# Patient Record
Sex: Female | Born: 1975 | Race: White | Hispanic: No | Marital: Married | Smoking: Never smoker
Health system: Northeastern US, Academic
[De-identification: ages and names within clinical notes are randomized; demographics above are authoritative.]

## PROBLEM LIST (undated history)

## (undated) ENCOUNTER — Encounter

## (undated) MED FILL — LOMAIRA 8 MG TABLET: 8 8 mg | ORAL | 30 days supply | Qty: 30 | Fill #0 | Status: CN

## (undated) MED FILL — LISDEXAMFETAMINE 30 MG CAPSULE: 30 30 mg | ORAL | 30 days supply | Qty: 30 | Fill #0 | Status: CN

## (undated) MED FILL — CONCERTA 36 MG TABLET,EXTENDED RELEASE: 36 36 mg | ORAL | 30 days supply | Qty: 30 | Fill #0 | Status: CN

## (undated) MED FILL — WEGOVY 0.25 MG/0.5 ML SUBCUTANEOUS PEN INJECTOR: 0.25 0.25 mg/0.5 mL | SUBCUTANEOUS | 28 days supply | Qty: 2 | Fill #0 | Status: CN

## (undated) MED FILL — WEGOVY 0.5 MG/0.5 ML SUBCUTANEOUS PEN INJECTOR: 0.5 0.5 mg/0.5 mL | SUBCUTANEOUS | 28 days supply | Qty: 2 | Fill #0 | Status: CN

## (undated) MED FILL — CELECOXIB 200 MG CAPSULE: 200 200 mg | ORAL | 90 days supply | Qty: 90 | Fill #0 | Status: CN

## (undated) MED FILL — VENLAFAXINE ER 150 MG CAPSULE,EXTENDED RELEASE 24 HR: 150 150 mg | ORAL | 90 days supply | Qty: 90 | Fill #0 | Status: CN

## (undated) MED FILL — METHYLPHENIDATE ER 36 MG TABLET,EXTENDED RELEASE 24 HR: 36 36 mg | ORAL | 60 days supply | Qty: 60 | Fill #0 | Status: CN

## (undated) MED FILL — MELOXICAM 15 MG TABLET: 15 15 mg | ORAL | 30 days supply | Qty: 30 | Fill #0 | Status: CN

## (undated) MED FILL — CYCLOSPORINE 0.05 % EYE DROPS IN A DROPPERETTE: 0.05 0.05 % | OPHTHALMIC | 90 days supply | Qty: 180 | Fill #0 | Status: CN

## (undated) MED FILL — WEGOVY 0.5 MG/0.5 ML SUBCUTANEOUS PEN INJECTOR: 0.5 0.5 mg/0.5 mL | SUBCUTANEOUS | 28 days supply | Qty: 2 | Fill #1 | Status: CN

## (undated) MED FILL — NAPROXEN 500 MG TABLET: 500 500 mg | ORAL | 30 days supply | Qty: 60 | Fill #0 | Status: CN

---

## 2015-10-15 ENCOUNTER — Ambulatory Visit (HOSPITAL_BASED_OUTPATIENT_CLINIC_OR_DEPARTMENT_OTHER)

## 2015-10-15 NOTE — Progress Notes (Signed)
* * *        **  [name]**    ------    22 Y old Female, DOB: 1975-09-01    [address] [place], [place] [address]    Home: [phone]    Provider: [name]        * * *    Telephone Encounter    ---    Answered by    Burnadette Pop    Date: 10/15/2015        Time: 06:03 PM    Caller    pt    ------            Reason    refill            Message                      please mail to her and let her know  last cpe 08/14/15 last refill 08/14/15 per [facility] last filled 08/14/15 only prescribed by you                      Action Taken    Huntleigh Doolen B 10/15/2015 6:39:54 PM > TF, please go ahead  and prepare. Then send to [facility] Xxx to approve. [facility] 10/16/2015 8:43:26 AM  > Rx printed, forwarded to Dr Lenore Manner 10/16/2015 9:04:20 AM >  noted PH Eleny Cortez B 10/16/2015 1:59:11 PM > OK.            Refills    Refill Vyvanse capsule, 70 mg, orally, 30, 1 cap(s), once a day  (in the morning), 30 days, Refills=0    ------          * * *                ---          * * *          Patient: [name] DOB: 06/28/1976 Provider: [name] 10/15/2015    ---    Note generated by eClinicalWorks EMR/PM Software ([email or web address])

## 2015-10-25 ENCOUNTER — Ambulatory Visit

## 2015-11-02 ENCOUNTER — Ambulatory Visit

## 2015-11-20 ENCOUNTER — Ambulatory Visit (HOSPITAL_BASED_OUTPATIENT_CLINIC_OR_DEPARTMENT_OTHER)

## 2015-11-20 NOTE — Progress Notes (Signed)
* * *        [name]**    ------    40 Y old Female, DOB: [birth year]    Account Number: [id number]    [address], [place], [place]    Home: [phone]    Guarantor: [name] Insurance: [employer]: HMO BLUE [place] - OPTIONS    PCP: Karlyne Greenspan, JR    Appointment Facility: [facility]        * * *    11/20/2015    Progress Notes: [name], NP    ------    ---        **Reason for Appointment**    ---      1\. Follow up on medication, only takes when she feels she needs to    ---      **History of Present Illness**    ---    _Follow-up_ :    Here for ADD f/u. Started on [place], which her son takes. Uses 4x a week.  Not Friday or weekends. Takes 1/2 of 70 mg. Is in meetings for hours and this  allows her to focus and pay attention. Has noticed a difference. Not  distracted. No side effect, no palpitations. One day took Sudafed and HR went  to 115. So she stopped it. No CP, irreg heartbeat.    Has depression and anxiety. Also started to work out. Good right now. Nice  weather helps. She works with Daron Offer and Jonnie Finner, psych NP. Just  works with them professionally, but they give her advice.    Had injection into back by Dr. Nona Dell. Helped 75%. Has f/u in June. The numbing  med worked great for 2-3 days, then came back slowly. He thinks it's a  fasciitis.    Has lost 15 lb by doing [employer] 4 days a week in the mornings.      **Current Medications**    ---    Taking        * Vyvanse 70 mg capsule 1 cap(s) orally once a day (in the morning)         ---          * Celexa 20 mg tablet TAKE ONE TABLET BY MOUTH DAILY         ---          * ProAir HFA CFC free 90 mcg/inh aerosol with adapter 2 puff(s) inhaled 20-30 min before exercise         ---          * Wellbutrin XL 150 mg/24 hours 2 tabs orally every 24 hours         ---          * Ativan 0.5 mg tablet 1 tab(s) orally HS, prn, Notes: prn         ---          * Zyrtec 10 mg tablet 1 tab(s) orally once a  day, Notes: prn         ---          * Fioricet 325 mg-50 mg-40 mg tablet 2 tab(s) orally Q4H maximum 6 per day prn, Notes: prn         ---          * Cyclobenzaprine Hydrochloride 5 mg tablet 1 tab(s) orally HS, can increase to TID as needed, Notes: prn         ---  Not-Taking      * Astelin 137 mcg/inh spray 2 spray(s) intranasally 2 times a day, Notes: prn         ---          * omeprazole 20 mg delayed release capsule 1 cap(s) orally once a day, Notes: prn         ---          * Medication List reviewed and reconciled with the patient         ---      **Past Medical History**    ---      anxiety - stable w/Celexa    ---    mirena IUD 7/06, 7/11    ---    anemia w/ preg, normal hgb at [facility] visit    ---    GERD - uses OTC Prilosec prn    ---    asthma, mild - occ use of ProAir    ---    migraines - respond to Fioricet, not often    ---    BMD 2009 - normal    ---    PAP SMEAR (ASC-US) with neg HPV 2008, all repeat PAPs WNL    ---      **Family History**    ---      Father: alive 76 yrs, anxiety, basal cell removed, depression,  cardiomyopathy, diagnosed with Heart Disease, Cancer    ---    Mother: alive 70 yrs, hypertension, Diabetes, hypothyroid, renal cell CA,  orthostatic tremor, MI. stents, CHF, diagnosed with Diabetes, Hypertension,  Heart Disease    ---    Sibling 1: alive 61 yrs, biological brother, drug addiction; hda 2 sisters and  4 brothers - one sister HIV who is dying; lost a sister to a brain aneurysm  (half sister), bio brother is an addict, 1/2 brother dying of ETOH and drug  induced liver disease, one 1/2 brothers one w/ulcerative colitis, one 1/2  brother has anxiety but is healthy, diagnosed with Mental Illness    ---    Maternal Grand Mother: deceased 75 yrs, died of an MI    ---    Aunt(s): deceased 46 yrs, cardiogenic shock and respiratory arrest from an MI,  smoker    ---    has 1 bio brother, 1 half sis died, has another sister and 3 half brothers;  cousin deceased from [place] age 82.  No breast, ovarian or colon cancers. Has 1  full brother and 5 half siblings; all healthy.    ---      **Social History**    ---    no Smoking  Status: never smoker  , Patient counseled on the dangers of  tobacco use: 11/20/2015.    Seat belt: yes.    Alcohol: socially.    no Drug use.    Diet/Nutrition: "gained the freshman 40," and more thereafter, daily yogurt,  no supplements, daily fruits and vegetables, whole grains, problem is portion  sizes and snacking/picking.    Exercise: yes, just started Cross[employer], walks a lot.    Marital Status: married, "awesome".    no Domestic violence.    Children: 2, son, [name], 10 yrs and daughter, [name] 13 yrs.    Number of household members: 4, self, spouse, 2 children.    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    no Travel outside [place].    Religion: Catholic.    Caffeine: yes, 2 cups coffee per day.  Home smoke detector use: yes.    Pets: yes, 2 dogs.    Sexual orientation: heterosexual.    Sexually active: yes.      **Gyn History**    ---    Periods : none on Mirena.    Sexual activity currently sexually active, monogamous.    Last pap smear date 08/02/14 Neg, 01/15/2010 Neg, absent TE zone.    Abnormal pap smear none.    Date of Last Period Mirena IUD, no periods.    STD none.    Birth control Mirena IUD.      **OB History**    ---    Total pregnancies 2\.    Total living children 2003 - Julia, 2006 - Joshua.    Pregnancy # 1: NSVD, no complications.    Pregnancy # 2: NSVD, no complications.      **Allergies**    ---      codeine: vomiting    ---      **Vital Signs**    ---    HR 72, BP 116/70, Temp 98.2, Ht 62, Wt 169, Wt Change -11 lb, BMI  30.91  .      **Examination**    ---    _General Examination_ :    General Appearance:  well developed and well-nourished  ,  alert and oriented  ,  speech normal in rate, tone and volume  ,  good eye contact  ,  thought  processes logical  .    HEENT:  clear conjunctiva  , o  ropharynx clear with MMM  .     Neck, Thyroid :  supple, no lymphadenopathy  ,  no thyromegaly  .    Heart:  RRR, normal S1S2  ,  no murmurs  .    Lungs:  clear to auscultation  ,  regular breathing rate and effort  .    Skin:  normal, no rash  .    Neurologic Exam:  non-focal exam  .          **Assessments**    ---    1\. 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 Chronic low back pain - M54.5    ---    3\. Obesity, unspecified - E66.9    ---    4\. 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 Attention deficit disorder of adult**    Stop Vyvanse capsule, 70 mg, 1 cap(s), orally, once a day (in the morning)    Continue Vyvanse capsule, 30 mg, 1 cap(s), orally, once a day (in the morning)    Notes: Doing well. Focus has improved. Taking 1/2 pill so will change RX to 30  mg. Not due for refill yet. No side effects. Advised to return in 6 months for  quick f/u.    ---        **2\. Chronic low back pain**    Notes: Had injection by Dr. Nona Dell, helped a lot initially. Effect now waning  but doing OK.        **3\. Obesity, unspecified**    Notes: Losing weight by exercising through Cross-[employer]. Is careful not to overdo  it. Congratulated on her success.        **4\. Depression with anxiety**    Continue Celexa tablet, 20 mg, TAKE ONE TABLET BY MOUTH DAILY    Continue  Wellbutrin XL, 150 mg/24 hours, 2 tabs, orally, every 24 hours    Continue Ativan tablet, 0.5 mg, 1 tab(s), orally, HS, prn, Notes: prn    Notes: Stable on meds. Doing well.      **Follow Up**    ---    6 months x 20 min (Reason: ADD)    Electronically signed by [name] , RNCFNP on 11/20/2015 at 09:34 AM EDT    Sign off status: Completed        * * [facility]    [address]    [place], [place] [address]    Tel: [phone]    Fax: [phone]              * * *          Patient: [name], [name] DOB: 10-24-1975 Progress Note: [name], NP  11/20/2015    ---    Note generated by eClinicalWorks  EMR/PM Software ([email or web address])

## 2016-01-01 ENCOUNTER — Ambulatory Visit (HOSPITAL_BASED_OUTPATIENT_CLINIC_OR_DEPARTMENT_OTHER)

## 2016-01-01 NOTE — Progress Notes (Signed)
* * *        **  [name]**    ------    89 Y old Female, DOB: 02-Mar-1976    [address] [place], [place] [address]    Home: [phone]    Provider: [name]        * * *    Telephone Encounter    ---    Answered by    Adrienne Mocha    Date: 01/01/2016        Time: 12:15 PM    Caller    pt    ------            Reason    Refills            Message                      pt lvm, would like refill of Fioricet (last rx 08/14/15) and refill of Vyvanse ( last filled 10/30/15), pt is UTD w/CPE 08/14/15. Please send Fioricet to [facility] and mail Vyvanse to pt at home                Action Taken    Fancy Dunkley B 01/01/2016 12:25:13 PM > Vyvanase printed,  Fioricet E-RX'd. [facility] 01/01/2016 2:48:51 PM > mailed Vyvanse rx to  pt            Refills    Refill Fioricet tablet, 325 mg-50 mg-40 mg, orally, 30, 2 tab(s),  Q4H maximum 6 per day prn, 90 days, Refills=0    ------      Refill Vyvanse capsule, 30 mg, orally, 30, 1 cap(s), once a day (in the  morning), Refills=0          * * *                ---          * * *          PatientTALETHA, [name] DOB: March 24, 1976 Provider: [name] 01/01/2016    ---    Note generated by eClinicalWorks EMR/PM Software ([email or web address])

## 2016-01-15 ENCOUNTER — Ambulatory Visit

## 2016-02-06 ENCOUNTER — Ambulatory Visit (HOSPITAL_BASED_OUTPATIENT_CLINIC_OR_DEPARTMENT_OTHER)

## 2016-02-06 NOTE — Progress Notes (Signed)
* * *        **  [name]**    ------    42 Y old Female, DOB: May 19, 1976    [address] [place], [place] [address]    Home: [phone]    Provider: [name]        * * *    Telephone Encounter    ---    Answered by    Adrienne Mocha    Date: 02/06/2016        Time: 05:13 PM    Caller    pt    ------            Reason    Refills            Message                      pt lvm, requests refill Ativan, CPE current, F/U 11/20/15, no pending appts      checked [facility], Ativan not listed                Action Taken    Ervie Mccard B 02/06/2016 5:47:18 PM > RX ready to fax.  [facility] 02/06/2016 5:58:44 PM > rx faxed            Refills    Refill Ativan tablet, 0.5 mg, orally, 30, 1 tab(s), HS, prn, 30  days, Refills=1    ------          * * *                ---          * * *          Patient: [name], [name] DOB: Sep 18, 1975 Provider: [name] 02/06/2016    ---    Note generated by eClinicalWorks EMR/PM Software ([email or web address])

## 2016-04-04 ENCOUNTER — Ambulatory Visit (HOSPITAL_BASED_OUTPATIENT_CLINIC_OR_DEPARTMENT_OTHER): Admitting: Family Medicine

## 2016-04-04 NOTE — Progress Notes (Signed)
* * *        [name]**    ------    66 Y old Female, DOB: 12/30/1975    [address] [place], [place] [address]    Home: [phone]    Provider: Manus Rudd        * * *    Telephone Encounter    ---    Answered by    Adrienne Mocha    Date: 04/04/2016        Time: 11:05 AM    Caller    pt    ------            Reason    Refills            Message                      pt lvm for refill Fioricet, CPE UTD, no pending appts, last Rx 01/01/16                Action Taken    [name],Jonda Alanis Maud Deed 04/04/2016 12:31:48 PM > refilled  [facility] 04/04/2016 12:42:53 PM > rx faxed, pt notified            Refills    Refill Fioricet tablet, 325 mg-50 mg-40 mg, orally, 30, 2 tab(s),  Q4H maximum 6 per day prn, 90 days, Refills=0    ------          * * *              * * *        ---        Reason for Appointment    ---      1\. Refills    ---      Current Medications    ---      None    ---      Past Medical History    ---      anxiety - stable w/Celexa    ---    mirena IUD 7/06, 7/11    ---    anemia w/ preg, normal hgb at [facility] visit    ---    GERD - uses OTC Prilosec prn    ---    asthma, mild - occ use of ProAir    ---    migraines - respond to Fioricet, not often    ---    BMD 2009 - normal    ---    PAP SMEAR (ASC-US) with neg HPV 2008, all repeat PAPs WNL    ---      Gyn History    ---    Periods : none on Mirena.    Sexual activity currently sexually active, monogamous.    Last pap smear date 08/02/14 Neg, 01/15/2010 Neg, absent TE zone.    Abnormal pap smear none.    Date of Last Period Mirena IUD, no periods.    STD none.    Birth control Mirena IUD.      OB History    ---    Total pregnancies 2\.    Total living children 2003 - Julia, 2006 - Joshua.    Pregnancy # 1: NSVD, no complications.    Pregnancy # 2: NSVD, no complications.      Allergies    ---      codeine: vomiting    ---      Treatment    ---      **  1\. Others**    Refill Fioricet tablet, 325 mg-50 mg-40 mg, 2  tab(s), orally, Q4H maximum 6  per day prn, 90 days, 30, Refills 0, Notes: prn    ---          * * *          Patient: [name], [name] DOB: 09/04/1975 Provider: Karlyne Greenspan, JR  04/04/2016    ---    Note generated by eClinicalWorks EMR/PM Software ([email or web address])

## 2016-06-02 ENCOUNTER — Ambulatory Visit (HOSPITAL_BASED_OUTPATIENT_CLINIC_OR_DEPARTMENT_OTHER)

## 2016-06-02 NOTE — Progress Notes (Signed)
* * *        **  [name]**    ------    39 Y old Female, DOB: 1975/12/19    [address], [place], [place] [address]    Home: [phone]    Provider: [name]        * * *    Web Encounter    ---    Answered by    [name]    Date: 06/02/2016        Time: 03:10 PM    Caller    KP    ------            Reason    Biometric Screening Form            Action Taken    Bevan Vu B 06/02/2016 3:10:33 PM > Signed. Pt requested  we send back to her. Sent her msg to see if she wants [place] to send to [facility]. [facility] 06/03/2016 2:08:29 PM > faxed form to pt and to [employer]                * * *        **eMessages**   From:    [name]    ------    Created:    2016-06-02 10:12:11.0    Sent:    2016-06-02 10:13:44.0    Subject:    [id number] Screening Form    Message:    [name],        I signed your biometric screening form. Do you want [place] to fax it to [facility] as well as to you or do you just want [place] to fax it to you?        [name]                    ---          * * *          Patient: [name] DOB: [birth year] Provider: [name] 06/02/2016    ---    Note generated by eClinicalWorks EMR/PM Software ([email or web address])

## 2016-10-09 ENCOUNTER — Ambulatory Visit: Admitting: General Acute Care Hospital

## 2016-10-11 LAB — HX .T-SPOT TB
CASE NUMBER: 2018095000962
HX T-SPOT TB: NEGATIVE
HX TB-PANEL-A: 0
HX TB-PANEL-B: 0

## 2016-12-03 ENCOUNTER — Ambulatory Visit (HOSPITAL_BASED_OUTPATIENT_CLINIC_OR_DEPARTMENT_OTHER)

## 2016-12-03 NOTE — Progress Notes (Signed)
* * *        [name]**    ------    41 Y old Female, DOB: 09-02-1975    Account Number: [id number]    [address], [place], [id number]    Home: [phone]    Guarantor: [name] Insurance: [employer]: HMO BLUE [place] - OPTIONS    PCP: Karlyne Greenspan, JR    Appointment Facility: [facility]        * * *    12/03/2016    Progress Notes: [name], NP    ------    ---        **Reason for Appointment**    ---      1\. lower back pain/sciatica-has a rough weekend, past cortisone with Nona Dell  not much help    ---    2\. needs CPE    ---    3\. low back pain on right side going into hip, needs refill Celexa, TF    ---      **History of Present Illness**    ---    _Sick Visit_ :    On Sat night her back was "spasming," pain going down her leg, really painful,  keeping her up at night. Has been chronic problem but got worse. A year ago  saw Dr. Nona Dell and she had 2 injections into her back @ SI joint. Didn't help.  At Christmas had a massage. Was told her R piraformis was very tight. Has been  opposed to PT but now willing to try it. Just wants ot be pain free.    _Lower back_ :    c/o low back pain. c/o radiation of pain:  to back of R knee, sometimes to the  foot, often has to keep moving if laying in bed  . c/o previous imaging:  MRI  .    Denies : trauma/injury:. tingling/ numbness:. bowel and bladder incontinence.  saddle anaesthesia. previous problems/surgeries:. previous surgery:  2  injections w/Dr. Nona Dell  . previous therapy:. Chronic pain meds:  otc meds,  ibuprofen, bothers her stomach  . osteoporosis. osteopenia.    ROM:  normal  .      **Current Medications**    ---    Taking        * Fioricet 325 mg-50 mg-40 mg tablet 2 tab(s) orally Q4H maximum 6 per day prn, Notes: prn         ---          * Celexa 20 mg tablet TAKE ONE TABLET BY MOUTH DAILY         ---          * ProAir HFA CFC free 90 mcg/inh aerosol with adapter 2 puff(s) inhaled 20-30 min before exercise          ---          * Zyrtec 10 mg tablet 1 tab(s) orally once a day, Notes: prn         ---    Not-Taking      * Vyvanse 30 mg capsule 1 cap(s) orally once a day (in the morning)         ---          * Ativan 0.5 mg tablet 1 tab(s) orally HS, prn, Notes: prn         ---          * Wellbutrin XL 150 mg/24 hours 2 tabs orally every 24 hours         ---          *  Cyclobenzaprine Hydrochloride 5 mg tablet 1 tab(s) orally HS, can increase to TID as needed, Notes: prn         ---          * Astelin 137 mcg/inh spray 2 spray(s) intranasally 2 times a day, Notes: prn         ---          * omeprazole 20 mg delayed release capsule 1 cap(s) orally once a day, Notes: prn         ---          * Medication List reviewed and reconciled with the patient         ---      **Past Medical History**    ---      anxiety - stable w/Celexa    ---    mirena IUD 7/06, 7/11    ---    anemia w/ preg, normal hgb at [facility] visit    ---    GERD - uses OTC Prilosec prn    ---    asthma, mild - occ use of ProAir    ---    migraines - respond to Fioricet, not often    ---    BMD 2009 - normal    ---    PAP SMEAR (ASC-US) with neg HPV 2008, all repeat PAPs WNL    ---      **Family History**    ---      Father: alive 13 yrs, anxiety, basal cell removed, depression,  cardiomyopathy, diagnosed with Heart Disease, Cancer    ---    Mother: alive 78 yrs, hypertension, Diabetes, hypothyroid, renal cell CA,  orthostatic tremor, MI. stents, CHF, diagnosed with Diabetes, Hypertension,  Heart Disease    ---    Sibling 1: alive 45 yrs, biological brother, drug addiction; hda 2 sisters and  4 brothers - one sister HIV who is dying; lost a sister to a brain aneurysm  (half sister), bio brother is an addict, 1/2 brother dying of ETOH and drug  induced liver disease, one 1/2 brothers one w/ulcerative colitis, one 1/2  brother has anxiety but is healthy, diagnosed with Mental Illness    ---    Maternal Grand Mother: deceased 29 yrs, died of an MI    ---    Aunt(s):  deceased 34 yrs, cardiogenic shock and respiratory arrest from an MI,  smoker    ---    has 1 bio brother, 1 half sis died, has another sister and 3 half brothers;  cousin deceased from [place] age 48. No breast, ovarian or colon cancers. Has 1  full brother and 5 half siblings; all healthy    half brother 75 yrs died liver disease.    ---      **Social History**    ---    no Smoking  Status: never smoker  , Patient counseled on the dangers of  tobacco use: 12/03/2016.    Seat belt: yes.    Alcohol: socially.    no Drug use.    Diet/Nutrition: "gained the freshman 40," and more thereafter, daily yogurt,  no supplements, daily fruits and vegetables, whole grains, problem is portion  sizes and snacking/picking.    Exercise: yes, just started Cross[employer], walks a lot.    Marital Status: married, "awesome".    no Domestic violence.    Children: 2, son, [name], 67 yrs and daughter, [name] 15 yrs.    Number of household members: 4, self,  spouse, 2 children.    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    no Travel outside [place].    Religion: Catholic.    Caffeine: yes, 2 cups coffee per day.    Home smoke detector use: yes.    Pets: yes, 2 dogs.    Sexual orientation: heterosexual.    Sexually active: yes.      **Gyn History**    ---    Periods : none on Mirena.    Sexual activity currently sexually active, monogamous.    Last pap smear date 08/02/14 Neg, 01/15/2010 Neg, absent TE zone.    Abnormal pap smear none.    Date of Last Period Mirena IUD, no periods.    STD none.    Birth control Mirena IUD.      **OB History**    ---    Total pregnancies 2\.    Total living children 2003 - Julia, 2006 - Joshua.    Pregnancy # 1: NSVD, no complications.    Pregnancy # 2: NSVD, no complications.      **Allergies**    ---      codeine: vomiting    ---      **Review of Systems**    ---    As per HPI.      **Vital Signs**    ---    HR 72, BP 120/80, Temp 98.6, Ht 62, Wt 169, BMI  30.91  .      **Examination**     ---    _General Examination_ :    General  well-nourished, well-hydrated, appears uncomfortable, frustrated  .    _Lower back_ :    Inspection:  normal curvature of spine  .    Palpation:  no vertebral spine tenderness  ,  no paraspinal spasm  ,  \+  tenderness on R SI joint and the sciatic notch  .    Straight leg raising test:  negative bilaterally, elevated R leg causes  burning sensation in R buttock  .    Motor system:  decreased quadriceps strength on R  ,  decreased hamstring  strength on R  .    Sensory exam:  normal bilateral LE  .    Reflexes:  bilaterally symmetrical  .    Gait:  normal  .    ROM  Full ROM w/increased pain from extension and hyperextension, R lateral  bending and R rotation  .          **Assessments**    ---    1\. Low back pain - M54.5 (Primary)    ---    2\. Other chronic pain - G89.29    ---    3\. 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 Had injections by Dr. Nona Dell  that didn't help. Tried chiro and massage. Never did PT. She did respond to a  steroid in the past. Will RX prednisone taper. Advised to avoid Motrin until  she finishes the steroid. Recommended PT at this point. Dr. Silvana Newness did indicate  that if she didn't responde to SI joint injections, she may benefit from L4-5,  L5-S1 injections. She may want to return to Dr. Nona Dell to discuss this option if  PT is ineffective. Piriformis Syndrome: Care Instructions material  was  printed,Piriformis Syndrome: Exercises material was printed.    ---        **2\. Other chronic pain**    Notes: See above.        **3\. Depression with anxiety**    Refill Celexa tablet, 20 mg, TAKE ONE TABLET BY MOUTH DAILY, 90 days, 90,  Refills 3    Notes: She will schedule her CPE. Needs her Celexa refilled. Reviewed  interactions w/Celexa and Cyclobenzaprine.      **Follow Up**    ---    Follow-up with any worsening or persistent symptoms.    Electronically signed by [name] , RNCFNP on 12/10/2016 at 11:46 AM EDT    Sign off status: Completed        * * [facility]    [address]    [place], [place] [address]    Tel: [phone]    Fax: [phone]              * * *          Patient: [name], [name] DOB: 16-Oct-1975 Progress Note: [name], NP  12/03/2016    ---    Note generated by eClinicalWorks EMR/PM Software ([email or web address])

## 2017-01-02 ENCOUNTER — Ambulatory Visit (HOSPITAL_BASED_OUTPATIENT_CLINIC_OR_DEPARTMENT_OTHER)

## 2017-01-02 NOTE — Progress Notes (Signed)
* * *        **  [name]**    ------    26 Y old Female, DOB: [birth year]    [address] [place], [place] [address]    Home: [phone]    Provider: [name]        * * *    Telephone Encounter    ---    Answered by    Adrienne Mocha    Date: 01/02/2017        Time: 02:30 PM    Caller    pt    ------            Reason    Refills            Message                      pt lvm, requesting refill of Astelin nasal spray, allergies have been really bad.  CPE pending 02/04/17.      [phone]                Action Taken    Teshaun Olarte B 01/02/2017 2:48:15 PM > Sent.            Refills    Refill Astelin spray, 137 mcg/inh, intranasally, 1, 2 spray(s), 2  times a day, 30 days, Refills=2    ------          * * *                ---          * * *          Patient: [name] DOB: 08/25/1975 Provider: [name] 01/02/2017    ---    Note generated by eClinicalWorks EMR/PM Software ([email or web address])

## 2017-02-04 ENCOUNTER — Ambulatory Visit (HOSPITAL_BASED_OUTPATIENT_CLINIC_OR_DEPARTMENT_OTHER)

## 2017-02-04 NOTE — Progress Notes (Signed)
* * *        **  [name]**    ------    85 Y old Female, DOB: 1976/06/16    [address], [place], [place] [address]    Home: [phone]    Provider: [name]        * * *    Telephone Encounter    ---    Answered by    Adrienne Mocha    Date: 02/04/2017        Time: 08:59 AM    Caller    pt    ------            Reason    Message            Message                      pt lvm, sorry she forgot about her appt today, has rescheduled to 10/3 @ 8am.  Do you need to see her before that for anything? She is doing ok, going to PT for back pain.  Requests refill of Motrin 800mg                 Action Taken    Wyat Infinger B 02/04/2017 1:21:27 PM > RX sent. I don't need  to see her before her appt as long as everything is going well.            Refills    Refill ibuprofen tablet, 800 mg, orally, 90, 1 tab(s), 3 times a  day, 30 days    ------          * * *                ---          * * *          Patient: [name] DOB: 1975-07-21 Provider: [name] 02/04/2017    ---    Note generated by eClinicalWorks EMR/PM Software ([email or web address])

## 2017-02-12 ENCOUNTER — Ambulatory Visit

## 2017-04-01 ENCOUNTER — Ambulatory Visit

## 2017-04-08 ENCOUNTER — Ambulatory Visit (HOSPITAL_BASED_OUTPATIENT_CLINIC_OR_DEPARTMENT_OTHER)

## 2017-04-08 ENCOUNTER — Ambulatory Visit

## 2017-04-08 LAB — HX COMPREHENSIVE METABOLIC PANEL
CASE NUMBER: 2018276000947
HX ALBUMIN LVL: 4.2 g/dL — NL (ref 3.2–5.0)
HX ALKALINE PHOSPHATASE: 68 U/L — NL (ref 30.0–117.0)
HX ALT: 32 U/L — NL (ref 6.0–55.0)
HX ANION GAP: 7 — NL (ref 3.0–11.0)
HX AST: 14 U/L — NL (ref 6.0–40.0)
HX BILIRUBIN TOTAL: 0.5 mg/dL — NL (ref 0.2–1.2)
HX BUN: 21 mg/dL — ABNORMAL HIGH (ref 6.0–20.0)
HX CALCIUM LVL: 9.8 mg/dL — NL (ref 8.5–10.5)
HX CHLORIDE: 103 mmol/L — NL (ref 98.0–110.0)
HX CO2: 30 mmol/L — NL (ref 21.0–32.0)
HX CREATININE: 0.873 mg/dL — NL (ref 0.55–1.3)
HX GLUCOSE LVL: 85 mg/dL — NL (ref 70.0–110.0)
HX POTASSIUM LVL: 4.2 mmol/L — NL (ref 3.6–5.2)
HX SODIUM LVL: 140 mmol/L — NL (ref 136.0–146.0)
HX TOTAL PROTEIN: 7.7 g/dL — NL (ref 6.0–8.4)

## 2017-04-08 LAB — HX CBC W/ INDICES
CASE NUMBER: 2018276000947
HX ABSOLUTE NRBC COUNT: 0 10*3/uL
HX HCT: 40.9 % — NL (ref 36.0–47.0)
HX HGB: 14 g/dL — NL (ref 11.8–16.0)
HX MCH: 30.4 pg — NL (ref 26.0–34.0)
HX MCHC: 34.2 g/dL — NL (ref 31.0–37.0)
HX MCV: 88.7 fL — NL (ref 80.0–100.0)
HX MPV: 10.1 fL — NL (ref 9.4–12.4)
HX NRBC PERCENT: 0 % — NL
HX PLATELET: 224 10*3/uL — NL (ref 150.0–400.0)
HX RBC: 4.61 10*6/uL — NL (ref 3.9–5.2)
HX RDW-CV: 12.2 % — NL (ref 11.5–14.5)
HX RDW-SD: 39.4 fL — NL (ref 35.0–51.0)
HX WBC: 7.3 10*3/uL — NL (ref 3.7–11.2)

## 2017-04-08 LAB — HX HEMOGLOBIN A1C
CASE NUMBER: 2018276000948
HX EST AVERAGE GLUCOSE (EAG): 103 mg/dL
HX HBF (INTERNAL): 1.3 % — NL
HX HEMOGLOBIN A1C: 5.2 % — NL
HX LA1C (INTERNAL): 1.9 % — NL
HX P3 PEAK (INTERNAL): 3.5 % — NL
HX P4 PEAK (INTERNAL): 1.1 % — NL
HX TOTAL AREA RANGE (INTERNAL): 2.16 microvolt/sec — NL (ref 1.0–3.5)

## 2017-04-08 LAB — HX LIPID PANEL
CASE NUMBER: 2018276000948
HX CHOL: 225 mg/dL — ABNORMAL HIGH
HX HDL: 54 mg/dL — NL
HX LDL: 140 mg/dL — ABNORMAL HIGH
HX TRIG: 153 mg/dL — ABNORMAL HIGH

## 2017-04-08 LAB — HX MAGNESIUM LEVEL
CASE NUMBER: 2018276000948
HX MAGNESIUM LVL: 2.1 mg/dL (ref 1.8–2.5)

## 2017-04-08 LAB — HX GLOMERULAR FILTRATION RATE (ESTIMATED)
CASE NUMBER: 2018276000947
HX AFN AMER GLOMERULAR FILTRATION RATE: >90
HX NON-AFN AMER GLOMERULAR FILTRATION RATE: 84 mL/min/{1.73_m2}

## 2017-04-08 LAB — HX TSH REFLEX PANEL (RECOMMENDED)
CASE NUMBER: 2018276000948
HX 3RD GEN TSH: 2.82 u[IU]/mL — NL (ref 0.358–3.74)

## 2017-04-08 NOTE — Progress Notes (Signed)
* * *        [name]**    ------    41 Y old Female, DOB: 1976-05-05    Account Number: [id number]    [address], [place], [id number]    Home: [phone]    Guarantor: [name] Insurance: [employer]: HMO BLUE [place] - OPTIONS    PCP: Karlyne Greenspan, JR    Appointment Facility: [facility]        * * *    04/08/2017    Progress Notes: [name], NP    ------    ---        **Reason for Appointment**    ---      1\. ecpe, anxiety, requests rx for Omeprazole    ---    2\. received Flu shot 04/01/17 at work . Female CPE 18-39 w/o PAP    ---      **History of Present Illness**    ---    _Interim History_ :    Here for CPE. Last 2/17 with ADD f/u 5/17 and sick visit for LBP 5/18. Now a  case Production designer, theatre/television/film for [employer].    Anxiety is bad. Taking Celexa, wants to try Wellbutrin as augmentation again.  Ran out of Ativan. Doesn't use it much. Not sleeping well. ADD is OK. Not on  Vyvance. It definitely helps but she just worries about taking it.    _Female CPE_ :    Recent consults -  Dr. Nona Dell R pain SI joint, injection, next appt tomorrow,  has helped 70%, PT didn't help  . PAP/HPV -  Date done - 08/02/14  , Neg,  UTD  ,  Repeat due - 07/2019  ,  No GU s/s, declines pelvic exam  . Mammogram -  Start at age 57, no FH  . Immunizations  Due forTdap (last 2007)  Had flu shot  . Contraception  Mirena IUD, replaced 2016, "fine"  .    _PHQ-9_ :    Little interest/pleasure in doing thing  Not at all - 0  . Feeling down,  depressed, or hopeless  Several days - 1  . Trouble falling/staying asleep, or  sleeping too much  More than half the days - 2  . Feeling tired or having  little energy  Not al all - 0  . Poor appetite or overeating  Several days - 1  . Feeling bad about self, like a failure, let people/self down  Several days -  1  . Trouble concentrating e.g. reading the paper, watching TV  Several days -  1  . Moving or speaking slowly or extremely fidgity  Not at all - 0  .  Thoughts of  being better off dead or hurting self  Not at all - 0  . Ability  to function at home/work or get along w/people  Somewhat difficult  .    score=6.      **Current Medications**    ---    Taking        * Celexa 20 mg tablet TAKE ONE TABLET BY MOUTH DAILY         ---          * Astelin 137 mcg/inh spray 2 spray(s) intranasally 2 times a day, Notes: prn         ---          * ibuprofen 800 mg tablet 1 tab(s) orally 3 times a day         ---          *  Fioricet 325 mg-50 mg-40 mg tablet 2 tab(s) orally Q4H maximum 6 per day prn, Notes: prn         ---          * ProAir HFA CFC free 90 mcg/inh aerosol with adapter 2 puff(s) inhaled 20-30 min before exercise         ---          * Zyrtec 10 mg tablet 1 tab(s) orally once a day, Notes: prn         ---          * Ativan 0.5 mg tablet 1 tab(s) orally HS, prn, Notes: prn         ---    Not-Taking      * cyclobenzaprine 10 mg tablet 1 tab(s) orally HS, can increase up to 3 times a day         ---          * omeprazole 20 mg delayed release capsule 1 cap(s) orally once a day, Notes: prn         ---    Discontinued      * Physical Therapy Please evaluate and treat for R sciatica, piriformis         ---          * PREDNISONE 10 MG TABLET 6 TABS QD X 2 DAYS THEN DECREASE BY 1 TAB EVERY 2 DAYS PO         ---          * Vyvanse 30 mg capsule 1 cap(s) orally once a day (in the morning)         ---          * Wellbutrin XL 150 mg/24 hours 2 tabs orally every 24 hours         ---          * Cyclobenzaprine Hydrochloride 5 mg tablet 1 tab(s) orally HS, can increase to TID as needed, Notes: prn         ---          * Medication List reviewed and reconciled with the patient         ---      **Past Medical History**    ---      anxiety - stable w/Celexa    ---    mirena IUD 7/06, 7/11    ---    anemia w/ preg, normal hgb at [facility] visit    ---    GERD - uses OTC Prilosec prn    ---    asthma, mild - occ use of ProAir    ---    migraines - respond to Fioricet, not often    ---    BMD 2009 -  normal    ---    PAP SMEAR (ASC-US) with neg HPV 2008, all repeat PAPs WNL    ---      **Surgical History**    ---      Tonsillectomy 2002    ---    Lasik 2005    ---      **Family History**    ---      Father: alive 61 yrs, anxiety, basal cell removed, depression,  cardiomyopathy, diagnosed with Heart Disease, Cancer    ---    Mother: alive 76 yrs, hypertension, Diabetes, hypothyroid, renal cell CA,  orthostatic tremor, MI. stents, CHF, diagnosed with Diabetes, Hypertension,  Heart Disease    ---  Sibling 1: alive 2 yrs, biological brother, drug addiction; hda 2 sisters and  4 brothers - one sister HIV who is dying; lost a sister to a brain aneurysm  (half sister), bio brother is an addict, 1/2 brother dying of ETOH and drug  induced liver disease, one 1/2 brothers one w/ulcerative colitis, one 1/2  brother has anxiety but is healthy, diagnosed with Mental Illness    ---    Maternal Grand Mother: deceased 105 yrs, died of an MI    ---    Aunt(s): deceased 44 yrs, cardiogenic shock and respiratory arrest from an MI,  smoker    ---    has 1 bio brother, 1 half sis died, has another sister and 3 half brothers;  cousin deceased from [place] age 41. No breast, ovarian or colon cancers. Has 1  full brother and 5 half siblings; all healthy    half brother 84 yrs died liver disease.    ---      **Social History**    ---    no Smoking  Status: never smoker  , Patient counseled on the dangers of  tobacco use: 04/08/2017.    Seat belt: yes.    Alcohol: socially.    no Drug use.    Diet/Nutrition: "gained the freshman 40," and more thereafter, daily yogurt,  no supplements, daily fruits and vegetables, whole grains, problem is portion  sizes and snacking/picking.    Exercise: yes, walks a lot.    Marital Status: married, "awesome".    no Domestic violence.    Children: 2, son, [name], 42 yrs and daughter, [name] 15 yrs.    Number of household members: 5, self, spouse, 2 children,and a 3yo foster  child.    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    no Travel outside [place].    Religion: Catholic.    Caffeine: yes, 2 cups coffee per day.    Home smoke detector use: yes.    Pets: yes, 1 dog.    Sexual orientation: heterosexual.    Sexually active: yes.      **Gyn History**    ---    Periods : none on Mirena.    Sexual activity currently sexually active, monogamous.    Last pap smear date 08/02/14 Neg, 01/15/2010 Neg, absent TE zone.    Abnormal pap smear none.    Date of Last Period Mirena IUD, no periods.    STD none.    Birth control Mirena IUD.      **OB History**    ---    Total pregnancies 2\.    Total living children 2003 - Julia, 2006 - Joshua.    Pregnancy # 1: NSVD, no complications.    Pregnancy # 2: NSVD, no complications.      **Allergies**    ---      codeine: vomiting    ---      **Hospitalization/Major Diagnostic Procedure**    ---      Denies Past Hospitalization    ---      **Review of Systems**    ---    _CONSTITUTIONAL_ :    Loss of Appetite  none  . Unexplained weight change  none  .    _OPTHALMOLOGY_ :    Change in Vision  none  . Glasses or contacts  none, had lasik surgery  ,  sees eye doctor regularly  .    _ENT_ :    Hoarseness  none  .  Swollen Lymph Nodes  none  . Difficulty swallowing  no  .  Dental care  up-to-date  .    _ALLERGY_ :    Environmental Allergies  lots of them but lately off meds  .    _RESPIRATORY_ :    Shortness of Breath  none  . Persistent Cough  none  .    _CARDIOLOGY_ :    Chest Pain  none  . Palpitations  none  . Leg Edema  none  . Exertional  symptoms  none  .    _GASTROENTEROLOGY_ :    Dysphagia  none  . Abdominal Pain  none  . Constipation  none  . Diarrhea  none  . Blood in Stool  none  . Heartburn  none  .    _FEMALE REPRODUCTIVE_ :    Hot Flashes  none  . Abnormal Vaginal Discharge  none  . Dyspareunia  none  .  Irregular Menses  not getting periods but gets cramps and moodiness, bloating  . Breast lump  none  . Practices SBE  regulary  . Vaginal  dryness  none  .    _UROLOGY_ :    Blood in Urine  none  . Urinary Incontinence  none  . Nocturia  None  .  Dysuria  none  .    _MUSCULOSKELETAL_ :    Joint Swelling  none  . Joint Pain  none  . Joint Stiffness  none  .    _NEUROLOGY_ :    Tingling/Numbness  none  . Dizziness  none  . Weakness  none  . Visual Changes  none  . Headaches  none  .    _PSYCHOLOGY_ :    Depression  as above  . Sleep Disturbances  none  . Anxiety  as above  .    _DERMATOLOGY_ :    New or changing skin lesions  none  .    _HEMATOLOGY/LYMPH_ :    Bleeding tendencies  none  .          **Vital Signs**    ---    HR 72, BP 118/78, Temp 98.4, Ht 62, Wt 177, Wt Change 8 lb, BMI  32.37  .      **Examination**    ---    _General Examination_ :    General  well nourished, well-hydrated female in  NAD, anxious  .    HEENT:  TMs and ear canals clear, conjunctiva clear, nares patent, oropharynx  clear with MMM  .    Neck, Thyroid :  supple  ,  no thyromegaly  ,  no lymphadenopathy  .    Breasts :  no lumps felt on either side  ,  no dimpling  ,  no skin changes  ,  no axillary lymphadenopathy  .    Heart:  RRR  ,  no murmurs  .    Lungs:  clear to auscultation bilaterally  .    Abdomen:  soft, NT/ND, BS present  ,  no hepatosplenomegaly  .    Extremities  normal ROM  ,  no edema  .    Peripheral pulses:  normal (2+) bilaterally  .    Skin:  no rash or suspicious skin lesions  .    Neurologic Exam:  non-focal exam  .          **Assessments**    ---    1\. Routine medical exam - Z00.00 (Primary)    ---  2\. Depression with anxiety - F41.8    ---    3\. 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 Acne - L70.9    ---    5\. Migraine headache - G43.909    ---    6\. 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 Allergic rhinitis - J30.9    ---    8\. GERD (gastroesophageal reflux disease) - K21.9    ---    9\. Chronic low back pain - M54.5    ---    10\. Encounter for administration of vaccine - Z23    ---    11\. Immunization due - Z23     ---      **Treatment**    ---      **1\. Routine medical exam**    _LAB: Comprehensive Metabolic Panel - [facility]_ Normal    Albumin Lvl    4.2      3.2-5.0 - Gm/dL    ------------    Alk Phos    68      30-117 - Units/L    ------------    ALT    32      6-55 - Units/L    ------------    AST    14      6-40 - Units/L    ------------    Bili Total    0.5      0.2-1.2 - mg/dL    ------------    BUN    21    H    6-20 - mg/dL    ------------    Calcium Lvl    9.8      8.5-10.5 - mg/dL    ------------    Chloride    103      98-110 - mmol/L    ------------    CO2    30      21-32 - mmol/L    ------------    Creatinine    0.873      0.550-1.300 - mg/dL    ------------    Glucose Lvl    85      70-110 - mg/dL    ------------    Potassium Lvl    4.2      3.6-5.2 - mmol/L    ------------    Sodium Lvl    140      136-146 - mmol/L    ------------    Total Protein    7.7      6.0-8.4 - Gm/dL    ------------    Anion Gap    7      3-11 -    ------------    _LAB: Glycated Hemoglobin [facility]_ Normal  Glycated Hgb    5.2      <=5.6 - %    ------------    Mean Bld Glucos    103      \- mg/dL    ------------    _LAB: Thyroid Screen (TSH, reflex to FT4, T4 or T3)_ Normal  3rd Gen TSH    2.820      0.358-3.740 - mclU/ml    ------------    _LAB: CBC w/ Indices - [facility]_ Normal  Hct    40.9      36.0-47.0 - %    ------------    Hgb    14.0      11.8-16.0 - Gm/dL    ------------  MCH    30.4      26.0-34.0 - pGm    ------------    MCHC    34.2      31.0-37.0 - Gm/dL    ------------    MCV    88.7      80.0-100.0 - fL    ------------    MPV    10.1      9.4-12.4 - fL    ------------    Platelet    224      150-400 - thous/mm3    ------------    RBC    4.61      3.90-5.20 - Mil/mm3    ------------    WBC    7.3      3.7-11.2 - thous/mm3    ------------    RDW-SD    39.4       35.0-51.0 - fL    ------------    _LAB: Lipid Panel_ chol 225, LDL 140, trig 153  Chol    225    H    <=200 -  mg/dL    ------------    HDL    54      >=40 - mg/dL    ------------    LDL    140    H    <=130 - mg/dL    ------------    Status    Fasting      \-    ------------    Trig    153    H    <=150 - mg/dL    ------------      Notes :Prugh,Melanie 04/09/2017 3:11:14 PM > Tevin Shillingford B 04/09/2017  6:57:04 PM > , 10 year ASCVD risk = 0.6 % - low risk, no indication for  medication, continue healthy diet and regular exercise. Lifetime risk 39%    ------    _LAB: Magnesium Level_ Normal  Magnesium Lvl    2.1      1.8-2.5 - mg/dL    ------------        Notes: Immunizations up-to-date. Tdap today. She had her flu shot. Patient  Educated with: TDAP - ADACEL.pdf (TDAP - ADACEL.pdf). PAP is UTD. Will start  mammograms at age 66.        **2\. Depression with anxiety**    Continue Celexa tablet, 20 mg, TAKE ONE TABLET BY MOUTH DAILY, 90 days, 90,  Refills 3    Refill Wellbutrin XL, 150 mg/24 hours, 2 tabs, orally, every 24 hours, 90  days, 180, Refills 0    Refill Ativan tablet, 0.5 mg, 1 tab(s), orally, HS, prn, 30 days, 30    Notes: She still struggles with her anxiety. Working with psych NP and LISCWs  who give her feedback and support. She was on Wellbutrin in the past but wants  to give it another try for augmentation. Will start w/1 tab and increase to 2.  She'll let me know through the portal how she's doing when she needs refills.        **3\. Exercise-induced asthma**    Continue ProAir HFA aerosol with adapter, CFC free 90 mcg/inh, 2 puff(s),  inhaled, 20-30 min before exercise    Notes: Stable.        **4\. Acne**    Notes: No issues currently.        **5\. Migraine headache**    Continue Fioricet tablet, 325 mg-50 mg-40 mg, 2 tab(s), orally, Q4H maximum 6  per day prn    Notes:  Francene Boyers has really helped. Requests refill of Fioricet so she has it prn.        **6\.  Attention deficit disorder of adult**    Stop Vyvanse capsule, 70 mg, 1 cap(s), orally, once a day (in the morning)    Notes: Not taking Vyvanse. Uses it when she's in school or studying. Otherwise  using behavioral methods.        **7\. Allergic rhinitis**    Continue Zyrtec tablet, 10 mg, 1 tab(s), orally, once a day    Refill Astelin spray, 137 mcg/inh, 2 spray(s), intranasally, 2 times a day, 90  days, 3, Refills 3    Notes: Allergies controlled. Astelin also helps her migraines.        **8\. GERD (gastroesophageal reflux disease)**    Refill omeprazole delayed release capsule, 20 mg, 1 cap(s), orally, once a  day, 90 days, 90, Refills 3    Notes: GERD aggravated by weight. Requests refill omeprazole. She tried Zantac  but not as effective. Had an EGD in the past. Discussed cost/benefit of PPIs.        **9\. Chronic low back pain**    Continue Cyclobenzaprine Hydrochloride tablet, 5 mg, 1 tab(s), orally, HS, can  increase to TID as needed    Start ibuprofen tablet, 800 mg, 1 tab(s), orally, 3 times a day, 90 days, 270,  Refills 3    Notes: Continues to see Dr. Nona Dell. Injections have been effective. Has SI,  sciatica and piroformis issues.        **10\. Others**    Notes: Patient Educated with: TD.pdf (TD.pdf).      **Immunization**    ---      Boostrix-Tdap : 0.5 mL (Dose No:1) given by Adrienne Mocha on Left Deltoid    ---      **Preventive Medicine**    ---      Counseling: Diet . Exercise . Sunscreen . Breast self exam .    ---    Screening / Special Tests: Pap Smear Up to Date. Routine eye exam/screening Up  to Date. Routine dental exams Up to date.    ---      **Procedure Codes**    ---      [phone] Boostrix-Tdap    ---    [phone] IMMUNIZATION ADMIN    ---      **Follow Up**    ---    Follow-up for annual visits or prn    Electronically signed by [name] , RNCFNP on 04/09/2017 at 06:58 PM EDT    Sign off status: Completed        * * [facility]    [address]     [place], [place] [address]    Tel: [phone]    Fax: [phone]              * * *          Patient: AERABELLA, GALASSO DOB: [birth year] Progress Note: [name], NP  04/08/2017    ---    Note generated by eClinicalWorks EMR/PM Software ([email or web address])

## 2017-04-09 ENCOUNTER — Ambulatory Visit (HOSPITAL_BASED_OUTPATIENT_CLINIC_OR_DEPARTMENT_OTHER)

## 2017-04-09 NOTE — Progress Notes (Signed)
* * *        **  [name]**    ------    82 Y old Female, DOB: 1975/11/22    [address] [place], [place] [address]    Home: [phone]    Provider: [name]        * * *    Web Encounter    ---    Answered by    [name]    Date: 04/09/2017        Time: 06:59 PM    Caller    KP    ------            Reason    Lab results                * * *        **eMessages**   From:    [name]    ------    Created:    2017-04-09 15:00:02.0    Sent:      Subject:    RE:Lab results    Message:    [name],        All your labs were fine. Take care and let me know if you have any questions.  I'll be on vacation through 10/23 so please reach out to your PCP if you have  any problems in the interim.        [name]                    ---          * * *          Patient: [name] DOB: 11/07/1975 Provider: [name] 04/09/2017    ---    Note generated by eClinicalWorks EMR/PM Software ([email or web address])

## 2017-04-23 ENCOUNTER — Ambulatory Visit

## 2017-05-19 ENCOUNTER — Ambulatory Visit (HOSPITAL_BASED_OUTPATIENT_CLINIC_OR_DEPARTMENT_OTHER)

## 2017-05-19 NOTE — Progress Notes (Signed)
* * *        [name]**    ------    41 Y old Female, DOB: 27-Dec-1975    Account Number: [id number]    [address] [place], [id number]    Home: [phone]    Guarantor: [name] Insurance: BCBS-: HMO BLUE NEW [place] - OPTIONS    PCP: Karlyne Greenspan, JR    Appointment Facility: [facility]        * * *    05/19/2017    Progress Notes: [name], NP    ------    ---        **Reason for Appointment**    ---      1\. Followup on on dentist appt - pt has infection, not sure what to do    ---    2\. L side sinus pain    ---    3\. Headaches    ---    4\. Flu vaccine administered at [place]ite    ---      **History of Present Illness**    ---    _Sick Visit_ :    Has had a lot of facial pain and swelling on the L side. Thought it was her  tooth who sent her to an oral surgeon. Xrays looked like inflammation on floor  of her L maxillary sinus. Gets swelling. Has had to use fioricet more often  the last few weeks. She saw an ED PA who started her on abx 2 months ago.  Augmentin x 5 days. Then happened 2 months ago and she just applied ice. The  dentist gave her an RX for Amox but the oral surgeon told her to see ENT. She  didn't start it. She feels miserable, like she has a sinus infection.    _ENT_ :    c/o EAR FULLNESS. c/o FACIAL PAIN.    Denies : EAR PAIN. NASAL CONGESTION  some pressure  . RHINITIS. POSTNASAL  DRIP. SORE THROAT. COUGH.    _Constitutional_ :    c/o FEVER  subjective  . c/o CHILLS. c/o HEADACHE  worse  .    _Pulmonology_ :    Denies : SHORTNESS OF BREATH. WHEEZING. TOBACCO USE.    _Gastroenterology_ :    Denies : ABDOMINAL PAIN. NAUSEA. VOMITING. DIARRHEA.      **Current Medications**    ---    Taking        * ibuprofen 800 mg tablet 1 tab(s) orally 3 times a day         ---          * Fioricet 325 mg-50 mg-40 mg tablet 2 tab(s) orally Q4H maximum 6 per day prn         ---          * Zyrtec 10 mg tablet 1 tab(s) orally once a day         ---          *  Astelin 137 mcg/inh spray 2 spray(s) intranasally 2 times a day         ---          * omeprazole 20 mg delayed release capsule 1 cap(s) orally once a day         ---          * Cyclobenzaprine Hydrochloride 5 mg tablet 1 tab(s) orally HS, can increase to TID as needed         ---          *  Celexa 20 mg tablet TAKE ONE TABLET BY MOUTH DAILY         ---          * Wellbutrin XL 150 mg/24 hours 2 tabs orally every 24 hours         ---          * Ativan 0.5 mg tablet 1 tab(s) orally HS, prn         ---    Not-Taking      * ProAir HFA CFC free 90 mcg/inh aerosol with adapter 2 puff(s) inhaled 20-30 min before exercise         ---          * ibuprofen 800 mg tablet 1 tab(s) orally 3 times a day         ---          * cyclobenzaprine 10 mg tablet 1 tab(s) orally HS, can increase up to 3 times a day         ---          * Medication List reviewed and reconciled with the patient         ---      **Past Medical History**    ---      anxiety - stable w/Celexa    ---    mirena IUD 7/06, 7/11    ---    anemia w/ preg, normal hgb at [facility] visit    ---    GERD - uses OTC Prilosec prn    ---    asthma, mild - occ use of ProAir    ---    migraines - respond to Fioricet, not often    ---    BMD 2009 - normal    ---    PAP SMEAR (ASC-US) with neg HPV 2008, all repeat PAPs WNL    ---      **Family History**    ---      Father: alive 92 yrs, anxiety, basal cell removed, depression,  cardiomyopathy, diagnosed with Heart Disease, Cancer    ---    Mother: alive 64 yrs, hypertension, Diabetes, hypothyroid, renal cell CA,  orthostatic tremor, MI. stents, CHF, diagnosed with Diabetes, Hypertension,  Heart Disease    ---    Sibling 1: alive 45 yrs, biological brother, drug addiction; hda 2 sisters and  4 brothers - one sister HIV who is dying; lost a sister to a brain aneurysm  (half sister), bio brother is an addict, 1/2 brother dying of ETOH and drug  induced liver disease, one 1/2 brothers one w/ulcerative colitis, one 1/2  brother has  anxiety but is healthy, diagnosed with Mental Illness    ---    Maternal Grand Mother: deceased 43 yrs, died of an MI    ---    Aunt(s): deceased 20 yrs, cardiogenic shock and respiratory arrest from an MI,  smoker    ---    has 1 bio brother, 1 half sis died, has another sister and 3 half brothers;  cousin deceased from [place] age 20. No breast, ovarian or colon cancers. Has 1  full brother and 5 half siblings; all healthy    half brother 34 yrs died liver disease.    ---      **Social History**    ---    no Tobacco Use  Status: never smoker  , Patient counseled on the dangers of  tobacco use: 04/08/2017.    Seat belt: yes.  Alcohol: socially.    no Drug use.    Diet/Nutrition: "gained the freshman 40," and more thereafter, daily yogurt,  no supplements, daily fruits and vegetables, whole grains, problem is portion  sizes and snacking/picking.    Exercise: yes, walks a lot.    Marital Status: married, "awesome".    no Domestic violence.    Children: 2, son, [name], 66 yrs and daughter, [name] 15 yrs.    Number of household members: 5, self, spouse, 2 children,and a 3yo foster  child.    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    no Travel outside [place].    Religion: Catholic.    Caffeine: yes, 2 cups coffee per day.    Home smoke detector use: yes.    Pets: yes, 1 dog.    Sexual orientation: heterosexual.    Sexually active: yes.      **Gyn History**    ---    Periods : none on Mirena.    Sexual activity currently sexually active, monogamous.    Last pap smear date 08/02/14 Neg, 01/15/2010 Neg, absent TE zone.    Abnormal pap smear none.    Date of Last Period Mirena IUD, no periods.    STD none.    Birth control Mirena IUD.      **OB History**    ---    Total pregnancies 2\.    Total living children 2003 - Julia, 2006 - Joshua.    Pregnancy # 1: NSVD, no complications.    Pregnancy # 2: NSVD, no complications.      **Allergies**    ---      codeine: vomiting    ---      **Review of  Systems**    ---    As per HPI.      **Vital Signs**    ---    HR 81, BP 120/80, Temp 98.3, O2 Sat. 96%, Ht 62, Wt 180, Wt Change 3 lb, BMI  32.92  .      **Physical Examination**    ---    _GENERAL_ :    General Appearence:  well nourished  ,  well-developed  ,  alert and oriented  ,  no acute distress  .    _HEENT_ :    Eyes:  conjunctiva clear  . Ears:  ear canals unremarkable  ,  TM's normal  bilaterally  . Nose:  turbinates very inflamed, swollen and tender  ,  congested  . Sinuses:  very tender R maxillary sinus, swollen  . Mouth:  moist  mucus membranes  ,  no lesions  . Throat:  no erythema or exudate  ,  no  tonsillar enlargement  .    _NECK_ :    General:  supple  . Cervical lymph nodes:  no lymphadenopathy  . Thyroid:  no  thyromegaly  ,  no nodules palpated  .    _HEART_ :    Rate:  regular  . Rhythm:  regular  . Heart sounds:  normal S1S2  . Murmurs:  none  .    _LUNGS_ :    Effort:  no respiratory distress  . Rate:  regular  . Auscultation:  CTA  bilaterally  ,  no wheezing/rhonchi/rales  . Airflow:  normal air movement  .          **Assessments**    ---    1\. Acute non-recurrent maxillary sinusitis - J01.00 (Primary)    ---  2\. Migraine headache - G43.909    ---      **Treatment**    ---      **1\. Acute non-recurrent maxillary sinusitis**    Start Augmentin tablet, 875 mg-125 mg, 1 tab(s), orally, every 12 hours, 14  day(s), 28    Notes: She has acute L maxillary sinusitis. Had full w/u by dentist and oral  surgeon who determined it wasn't her teeth. Will start her on Augmentin x  10-14 days. Her migraines have been triggered so will refill her Fioricet. She  doesn't take a lot of meds.    ---        **2\. Migraine headache**    Refill Fioricet tablet, 325 mg-50 mg-40 mg, 2 tab(s), orally, Q4H maximum 6  per day prn, 30    Notes: Discussed rebound. She doesn't take it a lot, but using more than usual  for herself.      **Follow Up**    ---    F/U any worsening or persistent symptoms.     Electronically signed by [name] , RNCFNP on 05/19/2017 at 10:21 AM EST    Sign off status: Completed        * * [facility]    [address]    [place], [place] [address]    Tel: [phone]    Fax: [phone]              * * *          Patient: CATE, ORAVEC DOB: May 03, 1976 Progress Note: [name], NP  05/19/2017    ---    Note generated by eClinicalWorks EMR/PM Software ([email or web address])

## 2017-06-03 ENCOUNTER — Ambulatory Visit (HOSPITAL_BASED_OUTPATIENT_CLINIC_OR_DEPARTMENT_OTHER): Admitting: Family Medicine

## 2017-06-03 NOTE — Progress Notes (Signed)
* * *        **  [name]**    ------    63 Y old Female, DOB: 17-Apr-1976    [address], [place], [place] [address]    Home: [phone]    Provider: Manus Rudd        * * *    Telephone Encounter    ---    Answered by    Ulis Rias    Date: 06/03/2017        Time: 02:32 PM    Caller    pt    ------            Reason    referral            Message                      please pt called requesting a referral for [facility]             Appt: 06/19/2017 @ 10:15am      dx: sinus pressure and pain      visits: 6      doctor: Barron Schmid NPI: pt did not know       phone: [phone]      fax:                 Action Taken    [facility] 06/03/2017 6:24:09 PM > will process                * * *                ---          * * *          Patient: [name], PURINGTON DOB: 02-06-1976 Provider: Karlyne Greenspan, JR  06/03/2017    ---    Note generated by eClinicalWorks EMR/PM Software ([email or web address])

## 2017-06-09 ENCOUNTER — Ambulatory Visit

## 2017-06-18 ENCOUNTER — Ambulatory Visit (HOSPITAL_BASED_OUTPATIENT_CLINIC_OR_DEPARTMENT_OTHER)

## 2017-06-18 NOTE — Progress Notes (Signed)
* * *        [name]**    ------    36 Y old Female, DOB: 06-01-1976    [address] [place], [place] [address]    Home: [phone]    Provider: [name]        * * *    Telephone Encounter    ---    Answered by    Ivy Lynn    Date: 06/18/2017        Time: 04:39 PM    Caller    pt left VM    ------            Reason    refill Ativan            Message                      requesting refill of Ativan 0.5mg , to [facility].  Checked [facility], last script written 04/08/17, filled same date.  CPE current, last FU appt 05/19/17, no pending appts.                Action Taken    [facility] 06/19/2017 10:59:05 AM > RX printed  Kleigh Hoelzer B 06/19/2017 12:38:09 PM > signed. [facility] 06/19/2017  12:42:23 PM > rx faxed            Refills    Continue Ativan tablet, 0.5 mg, orally, 30, 1 tab(s), HS, prn, 30  days, Refills=0    ------          * * *              * * *        ---        Reason for Appointment    ---      1\. refill Ativan    ---      Current Medications    ---    Taking      * ibuprofen 800 mg tablet 1 tab(s) orally 3 times a day     ---    * Zyrtec 10 mg tablet 1 tab(s) orally once a day     ---    * Astelin 137 mcg/inh spray 2 spray(s) intranasally 2 times a day     ---    * omeprazole 20 mg delayed release capsule 1 cap(s) orally once a day     ---    * Cyclobenzaprine Hydrochloride 5 mg tablet 1 tab(s) orally HS, can increase to TID as needed     ---    * Celexa 20 mg tablet TAKE ONE TABLET BY MOUTH DAILY     ---    * Wellbutrin XL 150 mg/24 hours 2 tabs orally every 24 hours     ---    * Ativan 0.5 mg tablet 1 tab(s) orally HS, prn     ---    * Augmentin 875 mg-125 mg tablet 1 tab(s) orally every 12 hours     ---    * Fioricet 325 mg-50 mg-40 mg tablet 2 tab(s) orally Q4H maximum 6 per day prn     ---    Not-Taking    * ProAir HFA CFC free 90 mcg/inh aerosol with adapter 2 puff(s) inhaled 20-30 min before exercise     ---    * ibuprofen 800  mg tablet 1 tab(s) orally 3 times a day     ---    * cyclobenzaprine 10  mg tablet 1 tab(s) orally HS, can increase up to 3 times a day     ---      Past Medical History    ---      anxiety - stable w/Celexa    ---    mirena IUD 7/06, 7/11    ---    anemia w/ preg, normal hgb at [facility] visit    ---    GERD - uses OTC Prilosec prn    ---    asthma, mild - occ use of ProAir    ---    migraines - respond to Fioricet, not often    ---    BMD 2009 - normal    ---    PAP SMEAR (ASC-US) with neg HPV 2008, all repeat PAPs WNL    ---      Gyn History    ---    Periods : none on Mirena.    Sexual activity currently sexually active, monogamous.    Last pap smear date 08/02/14 Neg, 01/15/2010 Neg, absent TE zone.    Abnormal pap smear none.    Date of Last Period Mirena IUD, no periods.    STD none.    Birth control Mirena IUD.      OB History    ---    Total pregnancies 2\.    Total living children 2003 - Julia, 2006 - Joshua.    Pregnancy # 1: NSVD, no complications.    Pregnancy # 2: NSVD, no complications.      Allergies    ---      codeine: vomiting    ---      Assessments    ---    1\. 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 Depression with anxiety**    Continue Ativan tablet, 0.5 mg, 1 tab(s), orally, HS, prn, 30 days, 30,  Refills 0    ---          * * *          PatientZULEICA, [name] DOB: [birth year] Provider: [name] 06/18/2017    ---    Note generated by eClinicalWorks EMR/PM Software ([email or web address])

## 2017-06-19 ENCOUNTER — Ambulatory Visit

## 2017-06-19 NOTE — Progress Notes (Signed)
* * *        **  [name]**    ------    12 Y old Female, DOB: Nov 10, 1975    [address], [place], [place], [place] [address]    Home: [phone]    Provider: America Brown        * * *    Telephone Encounter    ---    Answered by    Carlton Adam    Date: 06/19/2017        Time: 10:39 AM    Reason    rx clarifaction    ------            Message                      Imelda from [facility] pharm is calling to clarify if it is a dose pack Medrol 4 mg is a dose pack?                 Action Taken                      Siris Hoos B 06/19/2017 11:03:14 AM > yes      CLEGG,ASHLEY  06/19/2017 11:24:51 AM > lvm for pharmacy, ty                     * * *                ---          * * *          Patient: LASHONDRA, VAQUERANO DOB: 1975-11-17 Provider: Arsenio Loader B  06/19/2017    ---    Note generated by eClinicalWorks EMR/PM Software ([email or web address])

## 2017-06-19 NOTE — Progress Notes (Signed)
* * *        [name]**    ------    28 Y old Female, DOB: 17-Jan-1976    Account Number: [id number]    [address], [place], [id number]    Home: [phone]    Guarantor: [name] Insurance: [facility] Payer ID: [id number]    PCP: Annamary Rummage Referring: Annamary Rummage    Appointment Facility: [facility]        * * *    06/19/2017    Arsenio Loader, MD    ------    ---        **Current Medications**    ---    Taking      * Celexa     ---    * Flonase     ---    * omeprazole     ---    Not-Taking    * Fioricet 325 mg-50 mg-40 mg tablet 2 tab(s) orally every 6-8 hrs ,prn pain     ---    * Zyrtec     ---    * albuterol     ---    * Astelin 137 mcg/inh spray 2 spray(s) intranasally 2 times a day     ---    * Prednisone (high dose taper) 10 mg tab 6 tabs once a day x 7 days, then decrease by one tab per day until all tabs are gone orally     ---    * Medication List reviewed and reconciled with the patient     ---      Past Medical History    ---      Esophageal reflux.        ---    Depression.        ---      **Surgical History**    ---      tonsillectomy    ---      **Family History**    ---      Father: unknown, diagnosed with Unk Fam    ---    HIGH BLOOD PRESSURE, EMPHYSEMA\/COPD, THYROID DISEASE, CANCER, HEART DISEASE,  GERD, DIABETES, HIGH CHOLESTEROL, ASTHMA.    ---      **Social History**    ---    Smoking: yes  Are you a: former smoker  , How long has it been since you last  smoked? > 10 years.    no Recreational drug use.    Alcohol: yes, occasional.    SHE IS A NURSE.    ---      **Allergies**    ---      codeine    ---      **Hospitalization/Major Diagnostic Procedure**    ---      No Hospitalization History.    ---      **Review of Systems**    ---    _A detailed ROS was performed and is negative except for positives noted  below: (ROS sheet in office chart)._ :    Dizziness/vertigo yes. Sense of ear blockage yes. Ear pain yes. Sinus  problems  yes. Facial pain yes. Recurrent sore throats yes. Headaches yes.            **Reason for Appointment**    ---      1\. sinus pain    ---      **History of Present Illness**    ---    __ :    41 year old  female with a history of migraine headaches previously seen by  both Dr. Trinna Balloon and Dr. Sylvan Cheese for sinusitis here today complaining of facial pain  and pressure. Patient reports that since July she has had recurrent facial  pain/headaches involving both her cheeks and over her left eye and forehead.  Pain also radiates to her left ear. +photophobia. She also feels like the left  side of her face is swollen. Patient also recently noted some gum pain which  he thought was from flossing too hard. She was seen by her dentist told her  that her teeth looked fine but imaging at the dentist revealed "inflammation  in her sinuses". Since her last visit in this office she was seen by an  allergist. She underwent allergy testing which revealed "allergies to  everything", including cats, dogs, pollen, trees, dust. She considered  immunotherapy but did not have the time. She is currently using Flonase daily  and taking Zyrtec. She previously was also on Astelin but stopped this as she  did not feel like she noted any improvement with this medication. She reports  postnasal drip, but denies nasal congestion, rhinorrhea, mucopurulence. No  recent fevers. No recent imaging of her sinuses. She does have a history of  migraine headaches and was seen by a neurologist in the past. Patient reports  neurologist told her that the headaches were related to her allergies and her  sinuses. She is previously sleep taken Fioricet in the past for the headaches  but has not taken this recently. Was recently treated with a two-week course  of Augmentin. Did not help her symptoms.      **Examination**    ---    _Constitutional_ :    General Appearance:  awake, alert, no apparent distress  .    _Orbits_ :    EOM's  intact,  no  spontaneous nystagmus  .    _Ear_ :    Right:  Pinna without lesions. External auditory canal is clear without edema  or erythema. The tympanic membrane is intact without evidence of middle ear  effusion  . Left:  Pinna without lesions. External auditory canal is clear  without edema or erythema. The tympanic membrane is intact without evidence of  middle ear effusion  .    _Nose_ :    Speculum exam:  Anterior nasal cavity is clear bilaterally without crusting,  mucopurulence or epistaxis.Moderately edematpus nasal mucosa. Septum is  midline. Bilateral inferior turbinates normal appearing. No obvious masses or  polyps.  Osvaldo Angst cavity/Oropharynx_ :    Lips:  no lesions  . Teeth:  adaquate dentition  . FOM:  no masses or lesions  . Tongue:  symmetric movement, no masses or lesions  . Palate:  no masses or  lesions. Symmetric soft palate rise  . Oropharynx:  no tonsil mass or lesion,  posterior oropharyngeal wall mucosa without lesions  . Oral vestibule:  no  lesions  .    _Larynx/Hypopharynx_ :    voice  No hoarseness  .    _Neck_ :    Thyroid:  No palpable nodules  . Parotid  normal, no palpable masses  bilaterally  . Palpation:  No masses or lymphadenopathy  . Submandibular  gland:  normal, no masses  .    _Skin of Head/Neck_ :    :  No obvious lesions  .    _Cranial Nerve assessment_ :    Nerves 3 - 12:  symmetric bilaterally  .  _Head/Face_ :    Normocephalic/atraumtic. Face symmetric, House Brackmann grade I/VI  bilaterally.    _Respiratory_ :    non-labored on room air, no stridor/stertor. No increased work of breathing.          **Assessments**    ---    1\. Facial pain - R51 (Primary)    ---    2\. Non-seasonal allergic rhinitis due to other allergic trigger - J30.89    ---    3\. Otalgia of left ear - H92.02    ---      Nasal endoscopy showing mucosal edema however no evidence of active  sinusitis, no purulence or nasal polyps. The endoscopy was unremarkable. Given  the timing, location, and character  of his symptoms, the possibility that the  patient s headaches may not be solely of a sinonasal etiology was briefly  reviewed. The patient is asked to consider other contributors to headaches. We  discussed the various etiologies of facial pain, namely that, 1) sinonasal  problems can be the source, 2) sinonasal problems can be a trigger of facial  pain, or 3) facial pain is a neurologic problem by itself that can result in  sinonasal symptoms since the same nerves innervate both the face and sinonasal  areas. I will check a CT sinus to rule out occult sinus disease. If CT is  negative for sinus disease will consider possibility of visual migraines or  neurologist as a source of her pain. Discussed with patient that her left  otalgia is likely related to eustachian tube from her allergies. May also be  neurogenic in origin. Ear it self is normal appearing. Will treat with course  of steroids due to the persistent edema in her nasal cavity despite topical  steroids and PO antihistamine. Recommend use of nasal irrigations twice daily.  Continue with Flonase and p.o. antihistamine. She will follow-up after CT.    ---      **Treatment**    ---      **1\. Facial pain**    Start Medrol tablet, 4 mg, as directed, orally, as instructed, 6 days, 1,  Refills 0    _IMAGING: CT scan: Sinus (thin section for use preop with [facility])_    ---      **Procedures**    ---    _Fiberoptic endoscopy_ :    Technique  Flexible fiberoptic laryngoscopy performed under topical anesthesia  with neosynephrine/ lidocaine  . Indications  otalgia  . Findings    Scope passed through the RIGHT nasal cavity to the glottis and then withdrawn  without issue. Patient tolerated the procedure well.No mass or lesion in the  nasopharynx, oropharynx, supraglottis, hypopharynx, or glottis. The eustachian  tube orifices were patent bilaterally. The true vocal folds were mobile and  symmetric bilaterally. Airway widely patent.  . Consent  The consent form  was  reviewed with the patient and  signed  .    _SINONASAL endoscopy:_ :    Indication:  Evaluate the nasal cavity and nasopharynx  . Details of procedure  Bilateral flexible nasal endoscopy performed  . Findings  Nasal cavity: Nasal  mucosa decongested well but still somewhat boggy. Nasal cavities clear without  masses or lesions. Bilateral inferior turbinates and middle turbinates were  unremarkable without polyps or masses. The inferior and middle meati were  clear without obvious purulence or polyps. The sphenoethmoidal recess was  clear bilaterally. Septum midline. +Significant pain just with movement of  scope within the nasal cavities b/lNasopharynx: clear  without mass or lesion.  The eustachian tube orifices were clear bilaterally  .              **Procedure Codes**    ---      [id number] Sinus Endoscopy    ---      **Follow Up**    ---    Patient feels comfortable with the plan as outlined above, and was encouraged  to call or return earlier than planned with any questions, concerns, or  changes. Worrisome symptoms to look out for were reviewed. It was a pleasure  meeting with this patient today.    Electronically signed by Arsenio Loader , MD on 06/19/2017 at 08:44 AM EST    Sign off status: Completed        * * [facility]    [address]    [address]    [place], [place] [address]    Tel: [phone]    Fax: [phone]              * * *          Patient: [name], [name] DOB: 14-Nov-1975 Progress Note: Arsenio Loader, MD  06/19/2017    ---    Note generated by eClinicalWorks EMR/PM Software ([email or web address])

## 2017-06-22 ENCOUNTER — Ambulatory Visit

## 2017-06-24 ENCOUNTER — Ambulatory Visit

## 2017-07-17 ENCOUNTER — Ambulatory Visit

## 2017-07-17 NOTE — Progress Notes (Signed)
* * *        [name]**    ------    53 Y old Female, DOB: 11/27/1975    Account Number: [id number]    [address], [place], [id number]    Home: [phone]    Guarantor: [name] Insurance: [facility] Payer ID: [id number]    PCP: Annamary Rummage Referring: Annamary Rummage    Appointment Facility: [facility]        * * *    07/17/2017    Arsenio Loader, MD    ------    ---        **Current Medications**    ---    Taking      * Celexa     ---    * Flonase     ---    * omeprazole     ---    * Medrol 4 mg tablet as directed orally as instructed     ---    Not-Taking    * Fioricet 325 mg-50 mg-40 mg tablet 2 tab(s) orally every 6-8 hrs ,prn pain     ---    * Zyrtec     ---    * albuterol     ---    * Astelin 137 mcg/inh spray 2 spray(s) intranasally 2 times a day     ---    * Prednisone (high dose taper) 10 mg tab 6 tabs once a day x 7 days, then decrease by one tab per day until all tabs are gone orally     ---    * Medication List reviewed and reconciled with the patient     ---      Past Medical History    ---      Esophageal reflux.        ---    Depression.        ---      **Surgical History**    ---      tonsillectomy    ---      **Family History**    ---      Father: unknown, diagnosed with Unk Fam    ---    HIGH BLOOD PRESSURE, EMPHYSEMA\\\\\/COPD, THYROID DISEASE, CANCER, HEART  DISEASE, GERD, DIABETES, HIGH CHOLESTEROL, ASTHMA.    ---      **Social History**    ---    Smoking: yes  Are you a: former smoker  , How long has it been since you last  smoked? > 10 years.    no Recreational drug use.    Alcohol: yes, occasional.    SHE IS A NURSE.    ---      **Allergies**    ---      codeine    ---      **Hospitalization/Major Diagnostic Procedure**    ---      No Hospitalization History.    ---      **Review of Systems**    ---    _A detailed ROS was performed and is negative except for positives noted  below: (ROS sheet in office chart)._ :    No  positives. Patient indicates all items are negative. ..            **Reason for Appointment**    ---      1\. Sinusitis    ---      **History of Present Illness**    ---    __ :  42 year old female with a history of migraine headaches, facial pain/pressure,  environmental allergies, here for follow up after CT Sinus. Continues to have  facial pain, intermittent swelling of the left side of her face, and left  otalgia. Reports facial pain is worse on the left and feels like a "burning"  pain that extends from her left cheek and radiates up to left ear. Now with  nasal congestion, rhinorrhea but reports she developed a cold last week. No  improvement in her symptoms after the Medrol Dosepak. Reports she was  symptomatic at the time of the CT. Has used Flonase and nasal irrigations as  instructed.    CT Sinus 06/24/17 [facility]: Frontal sinuses absent bilaterally. Bilateral  maxillary, sphenoids, and anterior and posterior ethmoid air cells well  aerated. High riding maxillary tooth roots extending into the maxillary  sinuses bilaterally. Minimal thickening of inferior mucosa and left maxillary  sinus. Left-sided concha bullosa. Bilateral ostiomeatal complexes patent.      **Examination**    ---    _Constitutional_ :    General Appearance:  awake, alert, no apparent distress  .    _Orbits_ :    EOM's  intact,  no spontaneous nystagmus  .    _Ear_ :    Right:  Pinna without lesions. External auditory canal is clear without edema  or erythema. The tympanic membrane is intact without evidence of middle ear  effusion  . Left:  Pinna without lesions. External auditory canal is clear  without edema or erythema. The tympanic membrane is intact without evidence of  middle ear effusion  .    _Nose_ :    Speculum exam:  Anterior nasal cavity is clear bilaterally without crusting,  mucopurulence or epistaxis. Mildly edematous nasal mucosa. Septum is midline.  Bilateral inferior turbinates normal appearing. No obvious masses or  polyps.  Osvaldo Angst cavity/Oropharynx_ :    Lips:  no lesions  . Teeth:  adaquate dentition  . FOM:  no masses or lesions  . Tongue:  symmetric movement, no masses or lesions  . Palate:  no masses or  lesions. Symmetric soft palate rise  . Oropharynx:  no tonsil mass or lesion,  posterior oropharyngeal wall mucosa without lesions  . Oral vestibule:  no  lesions  .    _Larynx/Hypopharynx_ :    voice  No hoarseness  .    _Neck_ :    Thyroid:  No palpable nodules  . Parotid  normal, no palpable masses  bilaterally  . Palpation:  No masses or lymphadenopathy  . Submandibular  gland:  normal, no masses  .    _Skin of Head/Neck_ :    :  No obvious lesions  .    _Respiratory_ :    non-labored on room air, no stridor/stertor. No increased work of breathing.          **Assessments**    ---    1\. Facial pain - R51 (Primary)    ---    2\. Non-seasonal allergic rhinitis due to other allergic trigger - J30.89    ---    3\. Otalgia of left ear - H92.02    ---      CT sinus unremarkable. Given the timing, location, and character of his  symptoms, the possibility that the patient s headaches may not be solely of a  sinonasal etiology was reviewed. The patient is asked to consider other  contributors to headaches. We discussed the various etiologies of facial  pain,  namely that, 1) sinonasal problems can be the source, 2) sinonasal problems  can be a trigger of facial pain, or 3) facial pain is a neurologic problem by  itself that can result in sinonasal symptoms since the same nerves innervate  both the face and sinonasal areas. In light of a negative CT sinus and given  her histpry of migraines, suspect pain is neurogenic in origin. Additionally  the description of burning pain is suggestive of possible trigeminal  neuralgia. Suspect that otalgia is related to this likely neurogenic pain.  Endoscopy was unremarkable last visit and ear continues to be normal in  appearance. Recommend neurology evaluation. Patient will call [facility] for appt and obtain referral from PCP. Discussed that given her history  of allergies there may also be an allergic component to this. She last saw her  allergist about a year ago. Discussed that if negative neuro workup with then  recommend returning to the allergist for further evaluation and management.  She should continue Flonase, nasal irrigations, and p.o. antihistamine for  now.    ---      **Follow Up**    ---    Patient feels comfortable with the plan as outlined above, and was encouraged  to call or return earlier than planned with any questions, concerns, or  changes. Worrisome symptoms to look out for were reviewed. It was a pleasure  meeting with this patient today.    Electronically signed by Arsenio Loader , MD on 07/17/2017 at 08:55 AM EST    Sign off status: Completed        * * [facility]    [address]    [place], [place] [address]    Tel: [phone]    Fax: [phone]              * * *          Patient: [name], [name] DOB: [birth year] Progress Note: Arsenio Loader, MD  07/17/2017    ---    Note generated by eClinicalWorks EMR/PM Software ([email or web address])

## 2017-07-28 ENCOUNTER — Ambulatory Visit

## 2017-07-31 ENCOUNTER — Ambulatory Visit

## 2017-08-20 ENCOUNTER — Ambulatory Visit: Admitting: General Acute Care Hospital

## 2017-08-22 LAB — HX .T-SPOT TB
CASE NUMBER: 2019045001058
HX T-SPOT TB: NEGATIVE
HX TB-PANEL-A: 0
HX TB-PANEL-B: 0

## 2017-09-01 ENCOUNTER — Ambulatory Visit

## 2017-09-08 ENCOUNTER — Ambulatory Visit

## 2017-10-01 ENCOUNTER — Ambulatory Visit

## 2017-12-18 ENCOUNTER — Ambulatory Visit (HOSPITAL_BASED_OUTPATIENT_CLINIC_OR_DEPARTMENT_OTHER)

## 2017-12-18 NOTE — Progress Notes (Signed)
* * *        **  [name]**    ------    17 Y old Female, DOB: Oct 20, 1975    [address], [place], [place] [address]    Home: [phone]    Provider: [name]        * * *    Telephone Encounter    ---    Answered by    Doug Sou, LPN, Terri    Date: 12/18/2017        Time: 10:43 AM    Caller    pt    ------            Reason    Refills            Message                      pt lvm, requests refill flexeril, CPE 04/2017, had F/U 11/18, no pending appts                Action Taken    Zayvian Mcmurtry B 12/18/2017 3:31:14 PM > Sent. Fortunato, LPN  ,Terri 03/04/5620 4:21:54 PM > pt notified rx sent            Refills    Refill cyclobenzaprine tablet, 10 mg, orally, 30, 1 tab(s), HS,  can increase up to 3 times a day, 30 days, Refills=0    ------          * * *                ---          * * *          Patient: [name] DOB: Apr 10, 1976 Provider: [name] 12/18/2017    ---    Note generated by eClinicalWorks EMR/PM Software ([email or web address])

## 2017-12-22 ENCOUNTER — Ambulatory Visit

## 2018-02-01 ENCOUNTER — Ambulatory Visit (HOSPITAL_BASED_OUTPATIENT_CLINIC_OR_DEPARTMENT_OTHER)

## 2018-02-01 NOTE — Progress Notes (Signed)
* * *        **  [name]**    ------    89 Y old Female, DOB: 1976/01/26    [address] [place], [place] [address]    Home: [phone]    Provider: [name]        * * *    Telephone Encounter    ---    Answered by    [name]    Date: 02/01/2018        Time: 08:48 AM    Caller    pt left VM    ------            Reason    refill Fioricet            Message                      pt requesting refill of Fioricet, to [facility].      callback # [phone]                            Action Taken                      [name]  02/01/2018 9:33:39 AM > called pt, she is out of the medication, but she is okay to wait until tomorrow for refill.  Per chart, last refilled 05/19/17 at FU appt.  CPE current, last FU appt 05/19/17, no pending appts.        Calum Cormier B 02/01/2018 1:52:29 PM > Sent.       [name]  02/01/2018 1:58:24 PM > called pt and advised her of same.                Refills    Refill Fioricet tablet, 325 mg-50 mg-40 mg, orally, 30, 2 tab(s),  Q4H maximum 6 per day prn    ------          * * *                ---          * * *          Patient: [name] DOB: Oct 23, 1975 Provider: [name] 02/01/2018    ---    Note generated by eClinicalWorks EMR/PM Software ([email or web address])

## 2018-02-18 ENCOUNTER — Ambulatory Visit (HOSPITAL_BASED_OUTPATIENT_CLINIC_OR_DEPARTMENT_OTHER)

## 2018-02-18 NOTE — Progress Notes (Signed)
* * *        [name]**    ------    27 Y old Female, DOB: 1975/08/20    [address], [place], [place] [address]    Home: [phone]    Provider: [name]        * * *    Telephone Encounter    ---    Answered by    Doyce Loose    Date: 02/18/2018        Time: 06:18 PM    Caller    pt    ------            Reason    mri            Message                      would like to discuss getting an MRI on her back has been getting injections from [facility] and he referred her to  [facility] in [place] and they want MRI first and [facility] says theres no more care he can give her at this time its really confusing  please call [phone] or office [phone]                Action Taken                      Judene Logue B 02/19/2018 5:03:43 PM > Spoke to pt. She has failed PT, nerve blocks and steroid injections. Is better for a short time and sxs recur. I will order a repeat MRI as last one 2016. She is arranging to see a Equities trader and will get back to [place] w/appt date and details.        Golden,Rachel  02/22/2018 5:12:14 PM > faxed referral requset from [facility] Dr. Farris Has NPI [id number] DOS 03/18/18 dx: M54.31 NEW ORTHO appt. Looks like pt said she was going to get an appt at [facility] but this request is from [facility]. Will need a PHO. Spoke to RL, he said redirect back to [facility]. Called and spoke to pt, she will cx [facility] appt and schedule with  once MRI is scheduled. 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       Brandonn Capelli B 02/22/2018 5:26:27 PM > OK.                 Refills    Refill cyclobenzaprine tablet, 10 mg, orally, 30, 1 tab(s), HS,  can increase up to 3 times a day, 30 days, Refills=0    ------          * * *              * * *        ---        Reason for Appointment    ---      1\. Mri    ---      Assessments    ---    1\. Lumbago with sciatica, right side - M54.41 (Primary)    ---      Treatment    ---      **1\. Lumbago with sciatica, right side**    _IMAGING: MRI Spine  Lumbosacral C-_     Londan Coplen B 02/19/2018 5:10:21  PM : pt has failed PT, numerous steroid injections and nerve blocks    ------        **2\. Others**    Refill  cyclobenzaprine tablet, 10 mg, 1 tab(s), orally, HS, can increase up to  3 times a day, 30 days, 30, Refills 0          * * *          Patient: [name], [name] DOB: [birth year] Provider: [name] 02/18/2018    ---    Note generated by eClinicalWorks EMR/PM Software ([email or web address])

## 2018-02-24 ENCOUNTER — Ambulatory Visit (HOSPITAL_BASED_OUTPATIENT_CLINIC_OR_DEPARTMENT_OTHER)

## 2018-02-24 NOTE — Progress Notes (Signed)
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 You should be able  to read the report. The radiologist also noticed hypointense appearance of the  cervix. I don't know what that means, but let's get another PAP test as was  recommended in the report. You can call to schedule. I'm on vacation from 8/28  to 9/17 so OK if you want to schedule with another provider.        [name]                    ---          * * *          Patient: [name] DOB: 1976-01-04 Provider: [name] 02/24/2018    ---    Note generated by eClinicalWorks EMR/PM Software ([email or web address])

## 2018-02-24 NOTE — Progress Notes (Signed)
* * *        **  [name]**    ------    89 Y old Female, DOB: 03/22/1976    [address] [place], [place] [address]    Home: [phone]    Provider: [name]        * * *    Telephone Encounter    ---    Answered by    Cordie Grice    Date: 02/24/2018        Time: 11:40 AM    Caller    patient    ------            Reason    appointment            Message                      patient called, schedule ecpe for 04/21/18 @ 12pm - she stated she received a msg re: needing pap done . she wanted me to ask if its ok for her to wait until then and have it all done at her ecpe appointment .                 Action Taken                      Shanterica Biehler B 02/24/2018 11:55:20 AM > It was an incidental finding on her MRI and she had a PAP 3.5 years ago, so should be OK to wait until October, but can never say for sure.       Craig,Joanna  02/24/2018 2:26:22 PM > spoke with patient - she is aware                     * * *                ---          * * *          Patient: [name], [name] DOB: [birth year] Provider: [name] 02/24/2018    ---    Note generated by eClinicalWorks EMR/PM Software ([email or web address])

## 2018-02-25 ENCOUNTER — Ambulatory Visit

## 2018-03-01 ENCOUNTER — Ambulatory Visit (HOSPITAL_BASED_OUTPATIENT_CLINIC_OR_DEPARTMENT_OTHER): Admitting: Family Medicine

## 2018-03-01 NOTE — Progress Notes (Signed)
* * *        **  [name]**    ------    50 Y old Female, DOB: December 16, 1975    [address] [place], [place] [address]    Home: [phone]    Provider: Manus Rudd        * * *    Telephone Encounter    ---    Answered by    Jeralene Huff    Date: 03/01/2018        Time: 12:02 PM    Reason    referral request    ------            Message                      [facility] Dr. Glendell Docker NPI [id number] f: [phone] dx: M54.2 requesting 6 visits DOS 03/04/18      Ok to process referral?                Action Taken                      Thelma Barge E 03/01/2018 1:34:48 PM > ok      Golden,Rachel  03/01/2018 4:08:15 PM > will process referral                    * * *                ---          * * *          Patient: [name] DOB: January 22, 1976 Provider: Karlyne Greenspan, JR  03/01/2018    ---    Note generated by eClinicalWorks EMR/PM Software ([email or web address])

## 2018-03-04 ENCOUNTER — Ambulatory Visit: Admitting: Neurological Surgery

## 2018-03-04 ENCOUNTER — Ambulatory Visit: Admit: 2018-03-04 | Payer: HMO

## 2018-03-04 NOTE — Progress Notes (Signed)
.  Progress Notes  .  Patient: [name], [name]  Provider: Glendell Docker    .  DOB: 1976/07/07 Age: 42 Y Sex: Female  .  PCP: Katheran Awe MD  Date: 03/04/2018  .  --------------------------------------------------------------------------------  .  REASON FOR APPOINTMENT  .  1. Low back pain and right leg pain  .  HISTORY OF PRESENT ILLNESS  .  NEUROSURGERY:  42 year old female presents in  initial neurosurgical consultation with low back pain and right  leg pain. She has worked as a Engineer, civil (consulting) for many years. She had PT in  2015 without relief. She had MRI in 2016. She saw Dr. Silvana Newness and  Dr. Nona Dell for injections times six without much relief. The pain  radiates from the right buttock to the posterior thigh to the  posterior calf. She has tried PT recently from 05/2017 to 09/2017  without relief.  .  Associated Providers:  Primary Care Provider  Katheran Awe, MD .  .  CURRENT MEDICATIONS  .  Taking Allegra  Taking Celexa  Taking Motrin  Taking Wellbutrin  Medication List reviewed and reconciled with the patient  .  PAST MEDICAL HISTORY  .  Atypical facial nerve pain  .  ALLERGIES  .  Codeine Sulfate  .  SURGICAL HISTORY  .  Tonsillectomy  .  SOCIAL HISTORY  .  .  Tobaccohistory:Never smoked.  Marland Kitchen  HOSPITALIZATION/MAJOR DIAGNOSTIC PROCEDURE  .  Denies Past Hospitalization  .  REVIEW OF SYSTEMS  .  GENERAL:  .  Negative for:    weight loss within the past year,, weight gain  within the past year,, chest pain,, irregular heart beats,, heart  murmurs,, nausea / vomiting,, easy bruising,, fever / chills,,  frequent nose bleeds,, hoarse voice,, shortness of breath,,  breast discharge,, kidney disease,, liver disease,, previous  anesthesiea problems,, excess bleeding during or after prior  procedure, . Positive for:    frequent headache, .  .  NEUROLOGY:  .  Positive for:    back pain, weakness . Negative for:    ringing  in the ears,, trouble with balance and coordination,, hearing  loss,, vision loss in one eye or  the other,, difficulty  swallowing,, double vision,, loss of smell, .  .  VITAL SIGNS  .  Pain scale 8, Ht-in 62, Wt-lbs 180, BMI 32.92, BP 141/84, RR 16,  Pulse sitting 84.  .  PHYSICAL EXAMINATION  .  NEUROSURGERY:  MOTOR Lower Extremities  : 5/5 strength in the bilateral lower  extremities. :5/5 strength in the bilateral lower extremities.  SENSATION  : Intact to light touch in the bilateral lower  extremities. :Intact to light touch in the bilateral lower  extremities. Reflexes  Left Knee Jerk 2+, Right Knee Jerk 2+,  Left Ankle Jerk 2+, Right Ankle Jerk 2+. Left Knee Jerk2+ , Right  Knee Jerk2+ , Left Ankle Jerk2+ , Right Ankle Jerk2+.  DIAGNOSTIC STUDIES:  MRI  Uploaded to PACS. I reviewed MRI lumbar spine performed on  02/23/18 at [facility]) which is essentially a normal  study without any significant pathology or right sided nerve  compression.  .  ASSESSMENTS  .  Sciatica of right side - M54.31 (Primary)  .  Imp: Right sided sciatica with an umremarkable lumbar MRI and  normal neurologic exam. I cannot demonstrate a spinal origin for  this pain and therefore I'm going to refer her to physiatry to  evaluate for non-lumbar causes of sciatica  such as piriformis  syndrome, hamstring injury, etc...  .  FOLLOW UP  .  prn  .  Electronically signed by Glendell Docker , MD on  03/04/2018 at 05:00 PM EDT  .  Document electronically signed by Glendell Docker    .

## 2018-03-04 NOTE — Progress Notes (Signed)
* * *        [name]**    --- ---    42 Y old Female, DOB: [birth year], External MRN: [id number]    Account Number: [id number]    [address] [place], [place]    Home: [phone]    Insurance: [employer]    PCP: Dr. Katheran Awe, MD Referring: Enis Slipper    Appointment Facility: Neurosurgery        * * *    03/04/2018  Progress Notes: Glendell Docker, MD **CHN#:** [id number]    --- ---    ---         **Reason for Appointment**    ---       1\. Low back pain and right leg pain    ---       **History of Present Illness**    ---     _NEUROSURGERY_ :    42 year old female presents in initial neurosurgical consultation with low  back pain and right leg pain. She has worked as a Engineer, civil (consulting) for many years. She  had PT in 2015 without relief. She had MRI in 2016. She saw Dr. Silvana Newness and Dr.  Nona Dell for injections times six without much relief. The pain radiates from the  right buttock to the posterior thigh to the posterior calf. She has tried PT  recently from 05/2017 to 09/2017 without relief.     _Associated Providers_ :    Primary Care Provider Katheran Awe, MD .      **Current Medications**    ---    Taking     * Allegra     ---    * Celexa     ---    * Motrin     ---    * Wellbutrin     ---    * Medication List reviewed and reconciled with the patient    ---       **Past Medical History**    ---       Atypical facial nerve pain.        ---       **Surgical History**    ---       Tonsillectomy    ---      **Social History**    ---    Tobacco  history: Never smoked.      **Allergies**    ---       Codeine Sulfate    ---       **Hospitalization/Major Diagnostic Procedure**    ---       Denies Past Hospitalization    ---       **Review of Systems**    ---     _GENERAL_ :    Negative for: weight loss within the past year,, weight gain within the past  year,, chest pain,, irregular heart beats,, heart murmurs,, nausea /  vomiting,, easy bruising,, fever / chills,, frequent nose bleeds,,  hoarse  voice,, shortness of breath,, breast discharge,, kidney disease,, liver  disease,, previous anesthesiea problems,, excess bleeding during or after  prior procedure,. Positive for: frequent headache,.    _NEUROLOGY_ :    Positive for: back pain, weakness. Negative for: ringing in the ears,, trouble  with balance and coordination,, hearing loss,, vision loss in one eye or the  other,, difficulty swallowing,, double vision,, loss of smell,.          **Vital Signs**    ---  Pain scale 8, Ht-in 62, Wt-lbs 180, BMI 32.92, BP 141/84, RR 16, Pulse sitting  84.       **Physical Examination**    ---     _NEUROSURGERY_ :    MOTOR Lower Extremities : 5/5 strength in the bilateral lower extremities.  SENSATION : Intact to light touch in the bilateral lower extremities. Reflexes  Left Knee Jerk 2+, Right Knee Jerk 2+, Left Ankle Jerk 2+, Right Ankle Jerk  2+.    _DIAGNOSTIC STUDIES_ :    MRI Uploaded to PACS. I reviewed MRI lumbar spine performed on 02/23/18 at  [facility]) which is essentially a normal study without any  significant pathology or right sided nerve compression.          **Assessments**    ---    1\. Sciatica of right side - M54.31 (Primary)    ---      Imp: Right sided sciatica with an umremarkable lumbar MRI and normal  neurologic exam. I cannot demonstrate a spinal origin for this pain and  therefore I'm going to refer her to physiatry to evaluate for non-lumbar  causes of sciatica such as piriformis syndrome, hamstring injury, etc...    ---       **Follow Up**    ---    prn    Electronically signed by Glendell Docker , MD on 03/04/2018 at 05:00 PM EDT    Sign off status: Completed        * * *        Neurosurgery    [address] [place], [address]    [place], [place] [address]    Tel: [phone]    Fax: [phone]              * * *          Patient: [name], [name] DOB: September 12, 1975 Progress Note: Glendell Docker, MD  03/04/2018    ---    Note generated by eClinicalWorks EMR/PM Software  ([email or web address])

## 2018-03-25 ENCOUNTER — Ambulatory Visit (HOSPITAL_BASED_OUTPATIENT_CLINIC_OR_DEPARTMENT_OTHER)

## 2018-03-25 NOTE — Progress Notes (Signed)
* * *        [name]**    ------    55 Y old Female, DOB: 06-Aug-1975    [address] [place], [place] [address]    Home: [phone]    Provider: [name]        * * *    Web Encounter    ---    Answered by    Staff, Clinical    Date: 03/25/2018        Time: 08:56 PM    Caller    [name]    ------            Reason    Sciatica            Message                      Addressed To Jean Rosenthal to see a specialist at [facility], told me that my pain is not my spine. He believes it is all caused by my sciatic nerve. In the past you've given me Gabapentin but I didn't really take it as prescribed because as you know Meds give me anxiety. Would you be willing to allow me to try a low dose gabapentin again? I do have an upcoming appt with you on October. It's ok if you wanna like like me to wait until then.  Just figured I ask.       Thank you             [facility]                Action Taken                      Kysa Calais B 03/26/2018 8:31:05 AM > TF please check [place] PAT before I send in RX.       Fortunato, LPN ,Terri  6/38/7564 9:07:46 AM > per [place] PAT, no gabapentin listed, only  Lorazepam last filled 06/19/17 #30.       Shahrzad Koble B 03/26/2018 9:38:51 AM > Sent.                Refills    Start gabapentin capsule, 300 mg, orally, 90, 1 cap(s), 3 times a  day, 30 day(s)    ------          * * *        **eMessages**   From:    [name]    ------    Created:    2018-03-26 08:31:03.0    Sent:    2018-03-26 08:31:09.0    Subject:    PP:IRJJOACZ    Message:    [name],        Let's give it a try. Gabapentin dosage is 300 mg TID. I recommend you start  taking it at bedtime. Then see if you can or need to increase to BID then TID.  It can make you tired so it takes some getting used to. I just sent in a month  but it should last you longer unless you always take it TID. Let me know how  it goes.        [name]                    ---          * * *  Patient: [name], [name] DOB: [birth year] Provider: [name] 03/25/2018    ---    Note generated by eClinicalWorks EMR/PM Software ([email or web address])

## 2018-04-21 ENCOUNTER — Ambulatory Visit

## 2018-04-21 ENCOUNTER — Ambulatory Visit (HOSPITAL_BASED_OUTPATIENT_CLINIC_OR_DEPARTMENT_OTHER)

## 2018-04-21 NOTE — Progress Notes (Signed)
* * *        [name]**    ------    42 Y old Female, DOB: 06-15-1976    Account Number: [id number]    [address], [place], [place]    Home: [phone]    Guarantor: [name] Insurance: BCBS-: HMO BLUE [place] - OPTIONS    PCP: Karlyne Greenspan, JR    Appointment Facility: [facility]        * * *    04/21/2018    Progress Notes: [name], NP    ------    ---        **Reason for Appointment**    ---      1\. CPE, seeing podiatry tomorrow    ---    2\. 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 Last 10/18. Now a case Production designer, theatre/television/film for [employer].    Seeing podiatry tomorrow. Thinks she has plantar fasciitis. Back is still  awful. Has seen Dr. Silvana Newness. When she called for f/u because she can't do  anything the nurse called to say there is nothing more to do. He recommended  she see a spine specialist. Has tried injections from Dr. Nona Dell. She also saw a  neurosurgeon at [facility] who told her it's most likely her sciatic nerve. She'll  be seeing a rehab med doctor at [facility]. Can't go to the gym. Exhausted. Has  been to chiro, PT. She just started the neurontin recently, only HS at this  point, and feels it might be helping. She had the RX in the past but never  took it.    Anxiety is OK.    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 PAP/HPV -  Date done - 08/02/14  , Neg,  UTD  ,  Repeat due  - 07/2019  , but MRI indicated hypodense cervix and recommended PAP  .  Mammogram -  Start at age 77, no FH, will put in order  . Immunizations  Tdap  10/18, flu shot today  . Contraception  Mirena IUD, replaced 2016, "fine"  .    _PHQ-9_ :    Little interest/pleasure in doing thing  Not at all - 0  . Feeling down,  depressed, or hopeless  Several days - 1  .  Trouble falling/staying asleep, or  sleeping too much  Several days - 1  . Feeling tired or having little energy  Several days - 1  . Poor appetite or overeating  Several days - 1  . Feeling  bad about self, like a failure, let people/self down  Not at all - 0  .  Trouble concentrating e.g. reading the paper, watching TV  Not at all - 0  .  Moving or speaking slowly or extremely fidgity  Not at all - 0  . Thoughts of  being better off dead or hurting self  Not at all - 0  . Ability to function  at home/work or get along w/people  Not difficult at all  .    score=4.      **Current Medications**    ---    Taking      *  ProAir HFA CFC free 90 mcg/inh aerosol with adapter 2 puff(s) inhaled 20-30 min before exercise     ---    * ibuprofen 800 mg tablet 1 tab(s) orally 3 times a day     ---    * Zyrtec 10 mg tablet 1 tab(s) orally once a day     ---    * Astelin 137 mcg/inh spray 2 spray(s) intranasally 2 times a day     ---    * omeprazole 20 mg delayed release capsule 1 cap(s) orally once a day     ---    * Celexa 20 mg tablet TAKE ONE TABLET BY MOUTH DAILY     ---    * Ativan 0.5 mg tablet 1 tab(s) orally HS, prn     ---    * Fioricet 325 mg-50 mg-40 mg tablet 2 tab(s) orally Q4H maximum 6 per day prn     ---    * cyclobenzaprine 10 mg tablet 1 tab(s) orally HS, can increase up to 3 times a day     ---    * gabapentin 300 mg capsule 1 cap(s) orally 3 times a day     ---    Discontinued    * ibuprofen 800 mg tablet 1 tab(s) orally 3 times a day     ---    * Cyclobenzaprine Hydrochloride 5 mg tablet 1 tab(s) orally HS, can increase to TID as needed     ---    * Wellbutrin XL 150 mg/24 hours 2 tabs orally every 24 hours     ---    * Augmentin 875 mg-125 mg tablet 1 tab(s) orally every 12 hours     ---    * Wellbutrin XL 150 mg/24 hours tablet, extended release TAKE 2 TABLETS BY MOUTH EVERY 24 HOURS     ---    * Medication List reviewed and reconciled with the patient     ---      **Past Medical History**    ---       anxiety - stable w/Celexa.        ---    mirena IUD 7/06, 7/11.        ---    anemia w/ preg, normal hgb at [facility] visit.        ---    GERD - uses OTC Prilosec prn.        ---    asthma, mild - occ use of ProAir.        ---    migraines - respond to Fioricet, not often.        ---    BMD 2009 - normal.        ---    PAP SMEAR (ASC-US) with neg HPV 2008, all repeat PAPs WNL.        ---      **Surgical History**    ---      Tonsillectomy 2002    ---    Lasik 2005    ---      **Family History**    ---      Father: alive 39 yrs, anxiety, basal cell removed, depression,  cardiomyopathy, diagnosed with Heart Disease, Cancer    ---    Mother: alive 55 yrs, hypertension, Diabetes, hypothyroid, renal cell CA,  orthostatic tremor, MI. stents, CHF, Hypertension, Heart Disease, Diabetes    ---    Sibling 1: alive 51 yrs, biological brother, drug addiction; hda 2 sisters  and  4 brothers - one sister HIV who is dying; lost a sister to a brain aneurysm  (half sister), bio brother is an addict, 1/2 brother dying of ETOH and drug  induced liver disease, one 1/2 brothers one w/ulcerative colitis, one 1/2  brother has anxiety but is healthy, Mental Illness    ---    Maternal Grand Mother: deceased 7 yrs, died of an MI    ---    Aunt(s): deceased 28 yrs, cardiogenic shock and respiratory arrest from an MI,  smoker    ---    has 1 bio brother, 1 half sis died, has another sister and 3 half brothers;  cousin deceased from [place] age 34. No breast, ovarian or colon cancers. Has 1  full brother and 5 half siblings; all healthy    half brother 42 yrs died liver disease.    ---      **Social History**    ---    no Tobacco Use  Status: never smoker  , Patient counseled on the dangers of  tobacco use: 04/21/2018.    Alcohol: socially.    Marital Status: married, "awesome".    Children: 2, son, [name], 23 yrs and daughter, [name] 15 yrs.    Sexually active: yes.    Religion: Catholic.    Home smoke detector use: yes.    Pets: yes, 1 dog.     no Drug use, CBD oil on her back, helps.    Exercise: yes, walks a lot, difficult w/back pain.    Caffeine: yes, 2 cups coffee per day.    no Domestic violence.    Diet/Nutrition: "gained the freshman 40," and more thereafter, daily yogurt,  no supplements, daily fruits and vegetables, whole grains, problem is portion  sizes and snacking/picking.    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    Number of household members: 5, self, spouse, 2 children,and a 4yo foster  child.    Seat belt: yes.    no Travel outside [place].    Sexual orientation: heterosexual.      **Gyn History**    ---    Periods : none on Mirena.    Sexual activity currently sexually active, monogamous.    Last pap smear date 08/02/14 Neg, 01/15/2010 Neg, absent TE zone.    Abnormal pap smear none.    Date of Last Period Mirena IUD, no periods.    STD none.    Birth control Mirena IUD.      **OB History**    ---    Total pregnancies 2\.    Total living children 2003 - Julia, 2006 - Joshua.    Pregnancy # 1: NSVD, no complications.    Pregnancy # 2: NSVD, no complications.      **Allergies**    ---      codeine: vomiting    ---      **Hospitalization/Major Diagnostic Procedure**    ---      Denies Past Hospitalization    ---      **Review of Systems**    ---    _CONSTITUTIONAL_ :    Loss of Appetite  none  . Unexplained weight change  none  .    _OPTHALMOLOGY_ :    Change in Vision  none  . Glasses or contacts  none, had lasik surgery  ,  sees eye doctor regularly  .    _ENT_ :    Hoarseness  none  . Swollen Lymph Nodes  none  . Difficulty swallowing  no  .  Dental care  up-to-date  .    _ALLERGY_ :    Environmental Allergies  lots of them but lately off meds  .    _RESPIRATORY_ :    Shortness of Breath  none  . Persistent Cough  none  .    _CARDIOLOGY_ :    Chest Pain  none  . Palpitations  none  . Leg Edema  none  . Exertional  symptoms  none  .    _GASTROENTEROLOGY_ :    Dysphagia  none  . Abdominal Pain  none  .  Constipation  none  . Diarrhea  none  . Blood in Stool  none  . Heartburn  none  .    _FEMALE REPRODUCTIVE_ :    Hot Flashes  none  . Abnormal Vaginal Discharge  none  . Dyspareunia  none  .  Irregular Menses  not getting periods but gets cramps and moodiness, bloating  . Breast lump  none  . Practices SBE  regulary  . Vaginal dryness  none  .    _UROLOGY_ :    Blood in Urine  none  . Urinary Incontinence  none  . Nocturia  None  .  Dysuria  none  .    _MUSCULOSKELETAL_ :    Joint Swelling  none  . Joint Pain  foot pain, chronic LBP pain R side,  probably piriformis or sciatica  . Joint Stiffness  none  .    _NEUROLOGY_ :    Tingling/Numbness  none  . Dizziness  none  . Weakness  none  . Visual Changes  none  . Headaches  none  .    _PSYCHOLOGY_ :    Depression  as above  . Sleep Disturbances  none  . Anxiety  as above  .    _DERMATOLOGY_ :    New or changing skin lesions  none  .    _HEMATOLOGY/LYMPH_ :    Bleeding tendencies  none  .          **Vital Signs**    ---    HR 68, BP 122/80, Temp 98.4, Ht 62, Wt 183, Wt Change 3 lb, BMI  33.47  .      **Examination**    ---    _General Examination_ :    General  well nourished, well-hydrated female in  NAD  .    HEENT:  TMs and ear canals clear, conjunctiva clear, nares patent, oropharynx  clear with MMM  .    Neck, Thyroid :  supple  ,  no thyromegaly  ,  no lymphadenopathy  .    Breasts :  no lumps felt on either side  ,  no dimpling  ,  no skin changes  ,  no axillary lymphadenopathy  .    Heart:  RRR  ,  no murmurs  .    Lungs:  clear to auscultation bilaterally  .    Abdomen:  soft, NT/ND, BS present  ,  no hepatosplenomegaly  .    Female Genitourinary:  normal external genitalia, vagina pink with moist  mucosa, cervix w/o lesions or d/c, IUD string visible, uterus nontender and  normal size, adnexa without masses  .    Extremities  normal ROM  ,  no edema  .    Peripheral pulses:  normal (2+) bilaterally  .    Skin:  no rash or suspicious skin lesions  .  Neurologic Exam:  non-focal exam  .          **Assessments**    ---    1\. Routine medical exam - Z00.00 (Primary)    ---    2\. Routine gynecological examination - Z01.419    ---    3\. Depression with anxiety - F41.8    ---    4\. 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 Acne - L70.9    ---    6\. Migraine headache - G43.909    ---    7\. 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 Allergic rhinitis - J30.9    ---    9\. GERD (gastroesophageal reflux disease) - K21.9    ---    10\. Chronic low back pain - M54.5    ---    11\. Obesity (BMI 30.0-34.9) - E66.9    ---    12\. BMI 33.0-33.9,adult - Z68.33    ---    13\. Influenza vaccination administered at current visit - Z23    ---      **Treatment**    ---      **1\. Routine medical exam**    _LAB: CBC w/ Indices_    _LAB: Hemoglobin A1c_    _LAB: Lipid Panel_    _LAB: TSH Reflex Panel (Recommended)_    _IMAGING: [facility] Breast Screening e-sch*_     Notes :Sorn,Sokha  04/21/2018 1:12:55 PM > booked w/pt in office for 09-09-18 3:15pm @ [facility]. pt  has reminder    ------        Notes: Immunizations UTD. Flu shot today. PAP done today. She'll go for her  first mammogram.        **2\. Routine gynecological examination**    _LAB: Gyn Final Report_ Negative    Path GYN Request    Test performed at  [facility], [address] [place], [place] [address]      \-    ------------      Notes :Fortunato, LPN, Terri 29/52/8413 01:15:03 PM > source: endocervix,  LMP none due to IUD    ------    _LAB: HPV High Risk by TMA w/Reflex Genotype_ Negative  HPV Source    Endocervical      \-    ------------    HPV High Risk by TMA    Not Detected      \-    ------------      Notes :Fortunato, LPN, Terri 24/40/1027 01:15:03 PM > source: endocervix,  LMP none due to IUD Zelig Gacek B 04/23/2018 11:06:33 AM > neg PAP    ------        Notes:  PAP + HPV today. If neg, will repeat as per guidelines  .        **3\. Depression with  anxiety**    Stop Celexa tablet, 20 mg, TAKE ONE TABLET BY MOUTH DAILY    Refill Ativan tablet, 0.5 mg, 1 tab(s), orally, HS, prn, 30 days, 30, Refills  0    Start DULoxetine Hydrochloride delayed release capsule, 60 mg, 1 cap(s),  orally, once a day, 30 day(s), 30    Notes:  Stable on meds. Ativan refilled as she will be flying on a plane.  Checked [place] PAT. No signs of diversion. We decided to switch her to Cymbalta to  see if it helps with her pain. She'll get back to me in a month with a status  update  .        **4\. Exercise-induced asthma**    Continue ProAir HFA aerosol with adapter, CFC free 90 mcg/inh, 2 puff(s),  inhaled, 20-30 min before exercise    Notes:  Rarely needs inhaler  .        **5\. Acne**    Notes:  Stable. Treats w/OTCs  .        **6\. Migraine headache**    Continue Fioricet tablet, 325 mg-50 mg-40 mg, 2 tab(s), orally, Q4H maximum 6  per day prn    Notes:  Rare. Had to take Fioricet recently as allergies triggered migraine  .        **7\. Attention deficit disorder of adult**    Notes:  Not currently on meds. Managing on her own  .        **8\. Allergic rhinitis**    Continue Zyrtec tablet, 10 mg, 1 tab(s), orally, once a day    Continue Astelin spray, 137 mcg/inh, 2 spray(s), intranasally, 2 times a day    Notes:  Seasonal  .        **9\. GERD (gastroesophageal reflux disease)**    Refill omeprazole delayed release capsule, 20 mg, 1 cap(s), orally, once a  day, 90 days, 90, Refills 3    Notes:  Has needed to use more omeprazole. Advised she start a  multivitamin/mineral supplement. Reviewed recent study that found minimal risk  w/PPIs  .        **10\. Chronic low back pain**    Continue ibuprofen tablet, 800 mg, 1 tab(s), orally, 3 times a day    Continue cyclobenzaprine tablet, 10 mg, 1 tab(s), orally, HS, can increase up  to 3 times a day    Continue gabapentin capsule, 300 mg, 1 cap(s), orally, 3 times a day    Notes:  She'll try to neurontin. I also gave her the name of an  exercise  physiologist to consult. She should also ask the podiatrist to evaluate her  foot alignment, gait, sneakers, etc  .        **11\. Obesity (BMI 30.0-34.9)**    Notes:  Encouraged diet and exercise. She has a strong FH cardiac disease.  Hopefully her back pain will improve and she can exercise more  .        **12\. BMI 33.0-33.9,adult**    Notes:  As above  .        **13\. Others**    Notes: Patient Educated with: FLU Inactive.pdf (FLU Inactive.pdf)  Immunizations up-to-date.  Marland Kitchen      **Immunization**    ---      Influenza (split) adult : 0.5 mL (Dose No:1) (Route: Intramuscular) given  by Adrienne Mocha, LPN on Left Deltoid    ---      **Preventive Medicine**    ---      Counseling: Safe Sex . Dental Care . Diet . Exercise . Sunscreen . Breast  self exam .    ---      **Procedure Codes**    ---      Z6109 PAP/BREAST EXAM    ---    60454 Influenza (whole)    ---      **Follow Up**    ---    6 Months x 30 min (Reason: chronic issues f/u)    Electronically signed by [name] , RNCFNP on 04/23/2018 at 01:36 PM EDT    Sign off status: Completed        * * *  [facility]    [address] [place], [place] [address]    Tel: [phone]    Fax: [phone]              * * *          Patient: MAKYAH, LAVIGNE DOB: 13-Jun-1976 Progress Note: [name], NP  04/21/2018    ---    Note generated by eClinicalWorks EMR/PM Software ([email or web address])

## 2018-04-23 ENCOUNTER — Ambulatory Visit (HOSPITAL_BASED_OUTPATIENT_CLINIC_OR_DEPARTMENT_OTHER)

## 2018-04-23 LAB — HX HPV HIGH RISK BY TMA W/REFLEX GENOTYPE
CASE NUMBER: 2019289004077
HX HPV HIGH RISK BY TMA: NOT DETECTED

## 2018-04-23 LAB — HX GYN FINAL REPORT
CASE NUMBER: 0
HX FINAL CYTOLOGIC INTERPRETATION: NEGATIVE

## 2018-04-23 NOTE — Progress Notes (Signed)
* * *        **  [name]**    ------    63 Y old Female, DOB: [birth year]    [address], [place], [place] [address]    Home: [phone]    Provider: [name]        * * *    Web Encounter    ---    Answered by    [name]    Date: 04/23/2018        Time: 01:36 PM    Caller    KP    ------            Reason    PAP results                * * *        **eMessages**   From:    [name]    ------    Created:    2018-04-23 13:36:53.0    Sent:      Subject:    RE:PAP results    Message:    [name],            Your PAP and HPV tests were both negative. Current guidelines recommend  repeating your PAP/HPV in 5 years. Don't forget to get your blood work done.  Feel free to call me if you have any questions.            [name]                    ---          * * *          Patient: [name] DOB: [birth year] Provider: [name] 04/23/2018    ---    Note generated by eClinicalWorks EMR/PM Software ([email or web address])

## 2018-05-12 ENCOUNTER — Ambulatory Visit (HOSPITAL_BASED_OUTPATIENT_CLINIC_OR_DEPARTMENT_OTHER): Admitting: Family Medicine

## 2018-05-12 NOTE — Progress Notes (Signed)
* * *        **  [name]**    ------    11 Y old Female, DOB: 1975/12/03    [address], [place], [place] [address]    Home: [phone]    Provider: Manus Rudd        * * *    Telephone Encounter    ---    Answered by    Jeralene Huff    Date: 05/12/2018        Time: 05:10 PM    Reason    referral request    ------            Message                      [facility] Dr. Jerilee Field NPI [id number] f: [phone] dx: M54.2 requesting 6 visits DOS 05/14/18      no PHO needed since  but ok to process referral?                Action Taken                      Manus Rudd 05/13/2018 2:50:51 PM > ok to refer      Golden,Rachel  05/13/2018 3:05:42 PM > will process                    * * *                ---          * * *          Patient: [name] DOB: Aug 10, 1975 Provider: Karlyne Greenspan, JR  05/12/2018    ---    Note generated by eClinicalWorks EMR/PM Software ([email or web address])

## 2018-05-14 ENCOUNTER — Ambulatory Visit: Admitting: Physical Medicine & Rehabilitation

## 2018-05-14 ENCOUNTER — Ambulatory Visit

## 2018-06-08 ENCOUNTER — Ambulatory Visit (HOSPITAL_BASED_OUTPATIENT_CLINIC_OR_DEPARTMENT_OTHER)

## 2018-06-08 NOTE — Progress Notes (Signed)
* * *        **  [name]**    ------    59 Y old Female, DOB: 06/20/1976    [address], [place], [place] [address]    Home: [phone]    Provider: [name]        * * *    Telephone Encounter    ---    Answered by    Doug Sou, LPN, Terri    Date: 06/08/2018        Time: 05:26 PM    Caller    call to pt    ------            Reason    Message            Message                      Received fax from [facility], checking if pt is taking Duloxetine.                Action Taken                      [place], LPN ,Camelia Eng  13/08/4399 5:28:03 PM > Spoke to pt, pt stopped the Duloxetine, as she had bad withdrawal of Celexa, which she went back on.  Pt will again try to wean off Celexa, and restart Duloxetine.  Also pt needs refill of Gabapentin, pt is taking it TID.      Wildon Cuevas B 06/08/2018 5:54:43 PM > Noted. RX sent.                 Refills    Refill gabapentin capsule, 300 mg, orally, 90 Capsule, 1 cap(s), 3  times a day, 30 days, Refills=2    ------          * * *                ---          * * *          Patient: [name] DOB: 1975/08/10 Provider: [name] 06/08/2018    ---    Note generated by eClinicalWorks EMR/PM Software ([email or web address])

## 2018-06-21 ENCOUNTER — Ambulatory Visit

## 2018-06-21 ENCOUNTER — Ambulatory Visit: Admit: 2018-06-21 | Payer: HMO

## 2018-06-23 ENCOUNTER — Ambulatory Visit (HOSPITAL_BASED_OUTPATIENT_CLINIC_OR_DEPARTMENT_OTHER)

## 2018-06-23 ENCOUNTER — Ambulatory Visit

## 2018-06-23 LAB — HX HEMOGLOBIN A1C
CASE NUMBER: 2019352000633
HX EST AVERAGE GLUCOSE (EAG): 103 mg/dL
HX HBF (INTERNAL): 1.2 % — NL
HX HEMOGLOBIN A1C: 5.2 % — NL
HX LA1C (INTERNAL): 2 % — NL
HX P3 PEAK (INTERNAL): 5 % — NL
HX TOTAL AREA RANGE (INTERNAL): 122509 microvolt/sec — NL (ref 50000.0–350000.0)

## 2018-06-23 LAB — HX CBC W/ INDICES
CASE NUMBER: 2019352000633
HX ABSOLUTE NRBC COUNT: 0 10*3/uL
HX HCT: 41.3 % — NL (ref 36.0–47.0)
HX HGB: 14.2 g/dL — NL (ref 11.8–16.0)
HX MCH: 29.6 pg — NL (ref 26.0–34.0)
HX MCHC: 34.4 g/dL — NL (ref 31.0–37.0)
HX MCV: 86.2 fL — NL (ref 80.0–100.0)
HX MPV: 9.9 fL — NL (ref 9.4–12.4)
HX NRBC PERCENT: 0 % — NL
HX PLATELET: 239 10*3/uL — NL (ref 150.0–400.0)
HX RBC: 4.79 10*6/uL — NL (ref 3.9–5.2)
HX RDW-CV: 12 % — NL (ref 11.5–14.5)
HX RDW-SD: 38.3 fL — NL (ref 35.0–51.0)
HX WBC: 7.6 10*3/uL — NL (ref 3.7–11.2)

## 2018-06-23 LAB — HX LIPID PANEL
CASE NUMBER: 2019352000633
HX CHOL: 256 mg/dL — ABNORMAL HIGH
HX HDL: 49 mg/dL — NL
HX LDL: 163 mg/dL — ABNORMAL HIGH
HX TRIG: 218 mg/dL — ABNORMAL HIGH

## 2018-06-23 LAB — HX TSH REFLEX PANEL (RECOMMENDED)
CASE NUMBER: 2019352000633
HX 3RD GEN TSH: 3.45 u[IU]/mL — NL (ref 0.358–3.74)

## 2018-06-23 NOTE — Progress Notes (Signed)
* * *        **  [name]**    ------    30 Y old Female, DOB: [birth year]    [address], [place], [place] [address]    Home: [phone]    Provider: [name]        * * *    Web Encounter    ---    Answered by    [name]    Date: 06/23/2018        Time: 03:51 PM    Caller    KP    ------            Reason    Mammogram results                * * *        **eMessages**   FromDenna Haggard    ------    Created:    2018-06-23 15:51:50.0    Sent:      Subject:    WG:[id number] results    Message:    [name],                    I just received your mammogram report. It did not show anything worrisome or  suspicious. You should be receiving a letter from the radiologist shortly. I  usually recommend repeating your mammogram every year. Feel free to contact me  if you have any questions. Happy Holidays!                    [name]                    ---          * * *          Patient: [name] DOB: 1976/01/22 Provider: [name] 06/23/2018    ---    Note generated by eClinicalWorks EMR/PM Software ([email or web address])

## 2018-06-23 NOTE — Progress Notes (Signed)
* * *        **  [name]**    ------    55 Y old Female, DOB: 08/31/1975    [address], [place], [place] [address]    Home: [phone]    Provider: [name]        * * *    Web Encounter    ---    Answered by    [name]    Date: 06/23/2018        Time: 06:25 PM    Caller    KP    ------            Reason    Labs                * * *        **eMessages**   From:    [name]    ------    Created:    2018-06-23 18:28:32.0    Sent:      Subject:    GE:XBMW    Message:    [name],        Your labs were fine. Your cholesterol is up but when I put all your  information in the risk calculator, you come up low risk and not in the group  that requires medications. I would still work on all those healthy lifestyle  goals. I wasn't sure if you needed a biometric screening form completed. If  so, let me know and get it to me ASAP as I'm out of the office after Friday.  Take care and have a wonderful holiday.        [name]                    ---          * * *          Patient: [name] DOB: Apr 29, 1976 Provider: [name] 06/23/2018    ---    Note generated by eClinicalWorks EMR/PM Software ([email or web address])

## 2018-06-24 ENCOUNTER — Ambulatory Visit (HOSPITAL_BASED_OUTPATIENT_CLINIC_OR_DEPARTMENT_OTHER)

## 2018-06-24 NOTE — Progress Notes (Signed)
* * *        **  [name]**    ------    74 Y old Female, DOB: Nov 05, 1975    [address], [place], [place] [address]    Home: [phone]    Provider: [name]        * * *    Web Encounter    ---    Answered by    Staff, Clinical    Date: 06/24/2018        Time: 09:02 AM    Caller    [name]    ------            Reason    Medication            Message                      Addressed To Darl Householder,      One other thing I'm starting classes again could we start me back on Vyvanse. I do so much better with concentration when I'm medicated, even though I don't like taking it!!             If I need to come I can do that please let me know.       Thank you!      [facility]                Action Taken                      [name]  06/24/2018 9:41:23 AM > checked [place], no listing for Vyvanse being filled in the past calendar year.                    * * *        **eMessages**   From:    Valecia Beske    ------    Created:    2018-06-24 14:41:40.0    Sent:      Subject:    QP:[id number]    Message:    [name],        Because of the opioid crisis, we've had to tighten up our procedures for  controlled substances. You'll need an office visit and will have to sign a  contract and provide a urine sample for a tox screen. Then you'll need to  return in 6 months for a quick follow-up and repeat tox screen. I'm sure you  know that this is not personal and I have no hesitancy putting you back on  Vyvanse, but we have to be uniform on how we handle these things. Sorry. I'll  be back in the office in January so please schedule an appointment with me.  Thanks.        [name]                    ---          * * *          Patient: [name] DOB: 1975-11-29 Provider: [name] 06/24/2018    ---    Note generated by eClinicalWorks EMR/PM Software ([email or web address])

## 2018-06-24 NOTE — Progress Notes (Signed)
* * *        [name]**    ------    61 Y old Female, DOB: [birth year]    [address] [place], [place] [address]    Home: [phone]    Provider: [name]        * * *    Web Encounter    ---    Answered by    Staff, Clinical    Date: 06/24/2018        Time: 08:41 AM    Caller    [name]    ------            Reason    Biometric screen            Message                      Addressed To [name],      Thank you for the results. I did send my screening form yesterday with Attn: Aurther Loft hoping she got it. 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 Let's hope this helps the discomfort!             Merry Christmas!!      [facility]                Action Taken                      Dyanna Seiter B 06/24/2018 2:32:26 PM > TF, can you check for form?      Doug Sou, LPN ,Terri  82/95/6213 11:21:21 AM > Ready and on your desk      Liala Codispoti B 06/25/2018 1:54:16 PM > Signed.      Fortunato, LPN ,Terri  08/65/7846 2:51:34 PM > Faxed form, copy to scan, mailed original to pt, sent portal msg to pt                    * * *        **eMessages**   From:    Tempest Frankland    ------    Created:    2018-06-24 14:32:20.0    Sent:    2018-06-24 14:34:04.0    Subject:    NG:EXBMWUXLK screen    Message:    Interesting about the Botox. Hope it works. Good luck! I'll send  this to Camelia Eng so she can check for your form tomorrow.        [name]                * * *            From:    Doug Sou, LPN ,Terri    Created:    2018-06-25 14:55:37.0    Sent:      Subject:    GM:WNUUVOZDG screen    Message:                      [name] 06/24/2018 2:32:26 PM > TF, can you check for form?      Doug Sou, LPN ,Terri  64/40/3474 11:21:21 AM > Ready and on your desk      Jaley Yan B 06/25/2018 1:54:16 PM > Signed.      Fortunato, LPN ,Terri  25/95/6387 2:51:34 PM > Faxed form, copy to scan, mailed original to pt    [facility],    I faxed your form, made  a copy for  your chart and mailing you the original for  your records.  Merry Christmas!    Terri                        ---          * * *          Patient: [name], [name] DOB: Feb 04, 1976 Provider: [name] 06/24/2018    ---    Note generated by eClinicalWorks EMR/PM Software ([email or web address])

## 2018-07-05 ENCOUNTER — Ambulatory Visit (HOSPITAL_BASED_OUTPATIENT_CLINIC_OR_DEPARTMENT_OTHER)

## 2018-07-09 ENCOUNTER — Ambulatory Visit (HOSPITAL_BASED_OUTPATIENT_CLINIC_OR_DEPARTMENT_OTHER): Admitting: Family Medicine

## 2018-07-09 NOTE — Progress Notes (Signed)
* * *        **  [name]**    ------    89 Y old Female, DOB: 1976/06/26    [address] [place], [place] [address]    Home: [phone]    Provider: Manus Rudd        * * *    Telephone Encounter    ---    Answered by    Alben Deeds    Date: 07/09/2018        Time: 03:41 PM    Reason    fyi    ------            Message                      pt calling requesting referral to see dr.bohart zachary at [facility], will be seeing him for lower back pain on 07/12/2018 at 11am.       IHK:[id number]      [id number]:[phone]      [phone]                Action Taken                      Manus Rudd 07/09/2018 5:37:05 PM > this is an execise physiology doctor?      checo,datnery  07/10/2018 11:38:55 AM > his focus area is tone management including stroke, multiple sclerosis, spinal cord injury and traumatic brain injury.      Manus Rudd 07/13/2018 2:25:28 PM > ok to process      checo,datnery  07/26/2018 4:12:27 PM > done by RG                     * * *                ---          * * *          Patient: [name], [name] DOB: [birth year] Provider: Karlyne Greenspan, JR  07/09/2018    ---    Note generated by eClinicalWorks EMR/PM Software ([email or web address])

## 2018-07-12 ENCOUNTER — Ambulatory Visit: Admit: 2018-07-12 | Payer: HMO

## 2018-07-12 ENCOUNTER — Ambulatory Visit

## 2018-07-12 NOTE — Progress Notes (Signed)
.    Progress Notes  .  Patient: [name], [name]  Provider: Carola Frost    .  DOB: 12-Apr-1976 Age: 43 Y Sex: Female  .  PCP: Katheran Awe MD  Date: 07/12/2018  .  --------------------------------------------------------------------------------  .  HISTORY OF PRESENT ILLNESS  .  [facility]:   [name] is here for injection with botulinum toxin as previously  planned. We will use Dysport instead of Botox per [employer]  .  CURRENT MEDICATIONS  .  Taking Allegra 180 mg as needed  Taking Celexa 20 mg  Taking Flexeril 10 mg 10 mg Tablets one tablet orally every 8  hours prn pain  Taking GABAPENTIN 100 MG CAPSULE 100 mg tab orally TID  Taking Motrin 800 mg as needed  Taking Omeprazole 20 MG Capsule Delayed Release 1 capsule 30  minutes before morning meal Orally Once a day  .  PAST MEDICAL HISTORY  .  Atypical facial nerve pain  .  ALLERGIES  .  Codeine Sulfate  .  SURGICAL HISTORY  .  Tonsillectomy  .  FAMILY HISTORY  .  No family history of dystonia. Mother's brother with diagnosis of  MS.  .  SOCIAL HISTORY  .  .  Tobaccohistory:Never smoked.  .  ASSESSMENTS  .  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  TREATMENT  .  Other dystonia  Notes:  .  Risks and benefits of DYSPORT Intramuscular Neurolysis were  discussed with the patient, and informed consent was obtained.  Marland Kitchen  PROCEDURE: The patient was positioned in optimal position for  administration of the DYSPORT. The skin was cleaned with alcohol  at each injection site. Using a 27 gauge Teflon coated injection  needle and EMG guidance, the following muscles were localized and  injected with the specified amounts of DYSPORT, with bloodless  aspirate noted prior to each injection:  .  Right piriformis: 250 units  .  Needle electromyography demonstrated marked motor unit activity  at rest in each of the above muscles. The patient tolerated the  procedure well.  .  Effect of the Botox should be evident within 5-7 days with  full  benefit by 2-3 weeks. [facility] will be arranged in 4 weeks for an evaluation.  Please note that Dysport was used instead of Botox per [employer]  .  .  .  PROCEDURE CODES  .  841AB REH 95874 EMG GUIDE FOR BOTOX GLOBAL  .  57493 CHEMODENERV 1 EXTREMITY 1-4  .  FOLLOW UP  .  4 Weeks  .  Electronically signed by Carola Frost , MD on  07/12/2018 at 03:37 PM EST  .  Document electronically signed by Carola Frost    .

## 2018-07-12 NOTE — Progress Notes (Signed)
 * * *      [name]**    ------    89 Y old Female, DOB: 03/12/1976, External MRN: [id number]    Account Number: [id number]    [address], [place], [place]    Home: [phone]    Insurance: [employer] Payer ID: PAPER    PCP: Dr. Katheran Awe, MD Referring: Dr. Katheran Awe, MD  External Visit ID: [id number]    [facility]        * * *    07/12/2018 Progress Notes: Carola Frost, MD **CHN#:** [id number]    ------    ---       **Current Medications**    ---    Taking    * Allegra 180 mg as needed    ---    * Celexa 20 mg     ---    * Flexeril 10 mg 10 mg Tablets one tablet orally every 8 hours prn pain    ---    * GABAPENTIN 100 MG CAPSULE 100 mg tab orally TID    ---    * Motrin 800 mg as needed    ---    * Omeprazole 20 MG Capsule Delayed Release 1 capsule 30 minutes before morning meal Orally Once a day    ---     Past Medical History    ---      Atypical facial nerve pain.        ---      **Surgical History**    ---      Tonsillectomy    ---     **Family History**    ---      No family history of dystonia. Mother's brother with diagnosis of MS.    ---      **Social History**    ---    Tobacco history: Never smoked.     **Allergies**    ---      Codeine Sulfate    ---       **History of Present Illness**    ---     _[facility]_:    [name] is here for injection with botulinum toxin as previously planned. We will  use Dysport instead of Botox per [employer].      **Assessments**    ---    1\. 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 Other dystonia**    Notes:    Risks and benefits of DYSPORT Intramuscular Neurolysis were discussed with the  patient, and informed consent was obtained.    PROCEDURE:  The patient was positioned in optimal position for administration  of the DYSPORT. The skin was cleaned with alcohol at each injection site.  Using a 27  gauge Teflon coated injection needle and EMG guidance, the  following muscles were localized and injected with the specified amounts of  DYSPORT, with bloodless aspirate noted prior to each injection:    Right piriformis: 250 units    Needle electromyography demonstrated marked motor unit activity at rest in  each of the above muscles. The patient tolerated the procedure well.    Effect of the Botox should be evident within 5-7 days with full benefit by 2-3  weeks. [facility] will be  arranged in 4 weeks for an evaluation.  Please note  that Dysport was used  instead of Botox per [employer].    .    ---     **Procedure Codes**    ---      841AB REH 95874 EMG GUIDE FOR BOTOX GLOBAL    ---    54098 CHEMODENERV 1 EXTREMITY 1-4    ---      **Follow Up**    ---    4 Weeks    Electronically signed by Carola Frost , MD on 07/12/2018 at 03:37 PM EST    Sign off status: Completed        * * *        [facility]    [address]    [address]    [place], [place] [address]    Tel: [phone]    Fax: [phone]              * * *         Patient: [name], PRESTI DOB: 01-22-1976 Progress Note: Carola Frost, MD  07/12/2018    ---    Note generated by eClinicalWorks EMR/PM Software ([email or web address])

## 2018-07-12 NOTE — Progress Notes (Signed)
* * *      **  [name]**    ------    68 Y old Female, DOB: 04/14/1976    [address] [place], [place] [address]    Home: [phone]    Provider: Carola Frost        * * *    Telephone Encounter    ---    Answered by  Inocencio Homes Date: 07/12/2018       Time: 10:29 AM    Reason  Botox, Appt: 01/06    ------            Message                     12/27 - Faxed prior authorization to [facility]      01/03 - Faxed PA to [employer] [facility]      01/06 - BCBS denied Botox due to [facility] not trying Dysport.       01/06 - Dysport LOT# [id number], EXP: 04/06/2019, 1-500Unit Vial                    * * *                ---          * * *         Patient: [name], [name] DOB: 19-Sep-1975 Provider: Carola Frost 07/12/2018    ---    Note generated by eClinicalWorks EMR/PM Software ([email or web address])

## 2018-08-09 ENCOUNTER — Ambulatory Visit

## 2018-08-09 ENCOUNTER — Ambulatory Visit: Admit: 2018-08-09 | Payer: HMO

## 2018-08-11 ENCOUNTER — Ambulatory Visit (HOSPITAL_BASED_OUTPATIENT_CLINIC_OR_DEPARTMENT_OTHER)

## 2018-08-11 NOTE — Progress Notes (Signed)
* * *        **  [name]**    ------    27 Y old Female, DOB: Dec 19, 1975    [address], [place], [place] [address]    Home: [phone]    Provider: [name]        * * *    Web Encounter    ---    Answered by    Staff, Clinical    Date: 08/11/2018        Time: 05:13 PM    Caller    [name]    ------            Reason    New Refill Request            Message                      Addressed To        ***Start of Medication List: ***       ProAir HFA      CFC free 90 mcg/inh      2 puff(s)      20-30 min before exercise      ,      cyclobenzaprine      10 mg      1 tab(s)      HS      ,      Fioricet      325 mg-50 mg-40 mg      2 tab(s)      Q4H maximum 6 per day prn       ***End of Med List ***        Comment :                 Action Taken                      [name]  08/12/2018 8:54:41 AM > CPE current, has FU appt pending for 10/20/18                Refills    Refill ProAir HFA aerosol with adapter, CFC free 90 mcg/inh,  inhaled, 1, 2 puff(s), 20-30 min before exercise    ------      Refill cyclobenzaprine tablet, 10 mg, orally, 30, 1 tab(s), HS, can  increase up to 3 times a day, Refills=3      Refill Fioricet tablet, 325 mg-50 mg-40 mg, orally, 30, 2 tab(s), Q4H  maximum 6 per day prn, Refills=3          * * *        **eMessages**   From:    Ulus Hazen    ------    Created:    2018-08-12 10:39:02.0    Sent:      Subject:    RE:New Refill Request    Message:    [name],        I sent in your refills.        [name]                    ---          * * *          Patient: [name] DOB: 09/05/1975 Provider: [name] 08/11/2018    ---    Note generated by eClinicalWorks EMR/PM Software ([email or web address])

## 2018-08-12 ENCOUNTER — Ambulatory Visit (HOSPITAL_BASED_OUTPATIENT_CLINIC_OR_DEPARTMENT_OTHER)

## 2018-08-12 NOTE — Progress Notes (Signed)
* * *        **  [name]**    ------    27 Y old Female, DOB: Mar 29, 1976    [address] [place], [place] [address]    Home: [phone]    Provider: [name]        * * *    Telephone Encounter    ---    Answered by    [name]    Date: 08/12/2018        Time: 10:52 AM    Caller    [facility] calling    ------            Reason    Pro Air not covered            Message                      asking if it is okay to change ProAir to albuterol HFA inhaler as ProAir not covered by insurance.                Action Taken                      Jakaylee Sasaki B 08/12/2018 11:23:58 AM > Yes. I sent in new RX. Don't know if you need to f/u or not.      [name]  08/12/2018 11:57:39 AM >                 Refills    Start albuterol aerosol, 90 mcg/inh, inhaled, 1, 2 puff(s), every  6 hours, 30 day(s)    ------          * * *                ---          * * *          PatientAndi [name] DOB: 1976/06/18 Provider: [name] 08/12/2018    ---    Note generated by eClinicalWorks EMR/PM Software ([email or web address])

## 2018-08-16 ENCOUNTER — Ambulatory Visit (HOSPITAL_BASED_OUTPATIENT_CLINIC_OR_DEPARTMENT_OTHER): Admitting: Family

## 2018-08-16 NOTE — Progress Notes (Signed)
* * *        [name]**    ------    43 Y old Female, DOB: 1975-11-23    Account Number: [id number]    [address] [place], [place]    Home: [phone]    Guarantor: [name] Insurance: BCBS-[place]: HMO BLUE [place] OPTIONS  Payer ID: [id number]    PCP: Karlyne Greenspan, JR External Visit ID: [phone]    Appointment Facility: [facility]        * * *    08/16/2018    Progress Notes: Rubbie Battiest    ------    ---        **Current Medications**    ---    Taking      * ibuprofen 800 mg tablet 1 tab(s) orally 3 times a day     ---    * Zyrtec 10 mg tablet 1 tab(s) orally once a day     ---    * Astelin 137 mcg/inh spray 2 spray(s) intranasally 2 times a day     ---    * Ativan 0.5 mg tablet 1 tab(s) orally HS, prn     ---    * DULoxetine Hydrochloride 60 mg delayed release capsule 1 cap(s) orally once a day     ---    * omeprazole 20 mg delayed release capsule 1 cap(s) orally once a day     ---    * gabapentin 300 mg capsule 1 cap(s) orally 3 times a day     ---    * Celexa 20 mg tablet TAKE ONE TABLET BY MOUTH DAILY     ---    * ProAir HFA CFC free 90 mcg/inh aerosol with adapter 2 puff(s) inhaled 20-30 min before exercise     ---    * cyclobenzaprine 10 mg tablet 1 tab(s) orally HS, can increase up to 3 times a day     ---    * Fioricet 325 mg-50 mg-40 mg tablet 2 tab(s) orally Q4H maximum 6 per day prn     ---    * albuterol 90 mcg/inh aerosol 2 puff(s) inhaled every 6 hours     ---    * Medication List reviewed and reconciled with the patient     ---      Past Medical History    ---      anxiety - stable w/Celexa.        ---    mirena IUD 7/06, 7/11.        ---    anemia w/ preg, normal hgb at [facility] visit.        ---    GERD - uses OTC Prilosec prn.        ---    asthma, mild - occ use of ProAir.        ---    migraines - respond to Fioricet, not often.        ---    BMD 2009 - normal.        ---    PAP SMEAR (ASC-US) with neg HPV 2008, all repeat PAPs WNL.        ---      **Family  History**    ---      Father: alive 62 yrs, anxiety, basal cell removed, depression,  cardiomyopathy, diagnosed with Heart Disease, Cancer    ---    Mother: alive 69 yrs, hypertension, Diabetes, hypothyroid, renal  cell CA,  orthostatic tremor, MI. stents, CHF, Diabetes, Hypertension, Heart Disease    ---    Sibling 1: alive 18 yrs, biological brother, drug addiction; hda 2 sisters and  4 brothers - one sister HIV who is dying; lost a sister to a brain aneurysm  (half sister), bio brother is an addict, 1/2 brother dying of ETOH and drug  induced liver disease, one 1/2 brothers one w/ulcerative colitis, one 1/2  brother has anxiety but is healthy, Mental Illness    ---    Maternal Grand Mother: deceased 72 yrs, died of an MI    ---    Aunt(s): deceased 62 yrs, cardiogenic shock and respiratory arrest from an MI,  smoker    ---    has 1 bio brother, 1 half sis died, has another sister and 3 half brothers;  cousin deceased from [place] age 35. No breast, ovarian or colon cancers. Has 1  full brother and 5 half siblings; all healthy    half brother 69 yrs died liver disease.    ---      **Social History**    ---    no Tobacco Use  Status: never smoker  , Patient counseled on the dangers of  tobacco use: 08/16/2018.    Alcohol: socially.    Marital Status: married, "awesome".    Children: 2, son, [name], 56 yrs and daughter, [name] 15 yrs.    Sexually active: yes.    Religion: Catholic.    Home smoke detector use: yes.    Pets: yes, 1 dog.    no Drug use, CBD oil on her back, helps.    Exercise: yes, walks a lot, difficult w/back pain.    Caffeine: yes, 2 cups coffee per day.    no Domestic violence.    Diet/Nutrition: "gained the freshman 40," and more thereafter, daily yogurt,  no supplements, daily fruits and vegetables, whole grains, problem is portion  sizes and snacking/picking.    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    Number of household members: 5, self, spouse, 2 children,and a 4yo  foster  child.    Seat belt: yes.    no Travel outside [place].    Sexual orientation: heterosexual.      **Allergies**    ---      codeine: vomiting    ---      **Review of Systems**    ---    see hpi.        **Reason for Appointment**    ---      1\. please get flu swab/ body aches chest tightness, HA fevers x4 days.    ---      **History of Present Illness**    ---    _Sick Visit_ :    She began 4 days ago with body aches and fatigue. She lives with her mom who  is immunocompromised with COPD and would like a flu check. She has not taken  temp, but feels hot and cold. She has used Catering manager and Mucinex. She is  drinking a ton of water. She feels her chest is tight, feels SOB, especially  in the am, cough is like bronchospasm. She has hx of Ex Induced Asthma and has  been using Pro air inhaler BID to every 6 hours. Is a nurse at [employer], has had  alot of ill contacts.    _Constitutional_ :    c/o FATIGUE. c/o MYALGIAS. c/o FEVER/CHILLS/SWEATS  has felt  ill, hot and cold  .    _ENT_ :    c/o HEADACHE. c/o COUGH  she feels tight in am, has to take hot shower to  loosen anything, and using the albuterol every 6 hours  .    Denies : EAR PAIN. Denies : EAR FULLNESS. Denies : EYE SYMPTOMS. Denies :  FACIAL PAIN. Denies : NASAL CONGESTION. Denies : RHINITIS. Denies : POSTNASAL  DRIP. Denies : SORE THROAT. Denies : HOARSENESS.    _Pulmonology_ :    c/o SHORTNESS OF BREATH.    Denies : WHEEZING. Denies : TOBACCO USE.    _Gastroenterology_ :    c/o NAUSEA  has had some GI upset  .    Denies : ABDOMINAL PAIN. Denies : VOMITING. Denies : DIARRHEA.      **Vital Signs**    ---    HR **85** , BP **120/88** , Temp **99.2 po** , O2 Sat. **97%** , Ht 62, Wt  **189** , Wt Change 6 lb, BMI  **34.56** .      **Examination**    ---    _General Examination_ :    General  mildly ill apearing female,  pleasant  ,  alert and oriented  ,  well  developed and well- nourished  ,  well hydrated  .    HEENT:  Head - NC/AT  ,  clear  conjunctiva  ,  TM's normal  ,  turbinates  normal  ,  no sinus tenderness  ,  pharynx and tonsils normal  .    Oral cavity:  MMM  .    Neck, Thyroid :  supple  ,  no thyromegaly  ,  no lymphadenopathy  .    Heart:  no murmurs  ,  RRR, normal S1S2  .    Lungs:  clear to auscultation bilaterally  .          **Assessments**    ---    1\. Cough - R05 (Primary)    ---    2\. Influenza-like illness - R69    ---      **Treatment**    ---      **1\. Cough**    Start prednisone tablet, 50 mg, 1 tab(s), orally, once a day, 5 days, 5    Continue ProAir HFA aerosol with adapter, CFC free 90 mcg/inh, 2 puff(s),  inhaled, 20-30 min before exercise    Notes: will try pred burst for cough/bronchospasm, - has taken in the past.  Reviewed how to take, will continue Pro air as needed. Discussed symptomatic  care for cough, reviewed signs to call or seek care, will follow up [facility]I.    ---        **2\. Influenza-like illness**    _LAB: Rapid influenza test (In-house)_ neg    Negative    Negative        ------------      charles,ariurky 08/16/2018 3:30:19 PM > Scott Vanderveer 08/16/2018 4:29:17 PM >    ------        Notes: IN flu is negative, but with symptoms I think likey false negative.  Reviewed etiology and course of this.  Reviewed with her the etiology and  course of this. Discussed supportive measures of continuing Advil/Tylenol for  fever and body aches, to increase PO fluids and rest. Note for out of work.  reviewed warning signs to contact office. Follow up INI.  Marland Kitchen      **Procedure Codes**    ---  [address] RAPID INFLUENZA    ---      **Follow Up**    ---    F/U any worsening or persistent symptoms.    Electronically signed by Rubbie Battiest , FNP-C on 08/16/2018 at 05:47 PM EST    Sign off status: Completed        * * [facility]    [address] [place], [place] [address]    Tel: [phone]    Fax: [phone]              * * *          Patient: [name], [name] DOB: Nov 04, 1975  Progress Note: Rubbie Battiest 08/16/2018    ---    Note generated by eClinicalWorks EMR/PM Software ([email or web address])

## 2018-09-09 ENCOUNTER — Ambulatory Visit (HOSPITAL_BASED_OUTPATIENT_CLINIC_OR_DEPARTMENT_OTHER): Admitting: Family Medicine

## 2018-09-09 NOTE — Progress Notes (Signed)
* * *        **  [name]**    ------    76 Y old Female, DOB: 1976-06-23    [address], [place], [place] [address]    Home: [phone]    Provider: Manus Rudd        * * *    Telephone Encounter    ---    Answered by    Caprice Kluver    Date: 09/09/2018        Time: 11:50 AM    Caller    SUSAN - [place] - [phone]    ------            Reason    REFERRAL            Message                      REQUESTING REFERRAL FOR PT TO SEE DR. Carola Frost, RE[facility]IC[facility].  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

## 2018-09-25 ENCOUNTER — Ambulatory Visit (HOSPITAL_BASED_OUTPATIENT_CLINIC_OR_DEPARTMENT_OTHER)

## 2018-09-25 NOTE — Progress Notes (Signed)
* * *        [name]**    ------    67 Y old Female, DOB: [birth year]    [address] [place], [place] [address]    Home: [phone]    Provider: [name]        * * *    Web Encounter    ---    Answered by    Staff, Clinical    Date: 09/25/2018        Time: 08:36 PM    Caller    [name]    ------            Reason    New Refill Request            Message                      Addressed To        ***Start of Medication List: ***       ProAir HFA      CFC free 90 mcg/inh      2 puff(s)      20-30 min before exercise      ,      Ativan      0.5 mg      1 tab(s)      HS      ,      ibuprofen      800 mg      1 tab(s)      3 times a day       ***End of Med List ***        Comment :                 Action Taken                      Young, RN ,[name] 09/27/2018 7:30:58 AM > per [facility] pat Lorazepam last filled on 04/21/2018 for #30. cpe 04/21/2018.      Fortunato, LPN ,Terri  11/04/1482 4:58:19 PM > Rx printed      Jody Aguinaga B 10/01/2018 5:00:15 PM > Will sign next week.      Gianna Calef B 10/05/2018 9:34:21 AM > Signed.      Fortunato, LPN ,Terri  0/39/7953 11:18:58 AM > Rx faxed                Refills    Refill ProAir HFA aerosol with adapter, CFC free 90 mcg/inh,  inhaled, 1, 2 puff(s), 20-30 min before exercise, 30 days, Refills=1    ------      Refill Ativan tablet, 0.5 mg, orally, 30, 1 tab(s), HS, prn, 30 days,  Refills=0      Refill ibuprofen tablet, 800 mg, orally, 90 Tablet, 1 tab(s), 3 times a  day, 30 days, Refills=0          * * *        **eMessages**   From:    Antwione Picotte    ------    Created:    2018-09-27 10:00:00.0    Sent:    2018-09-27 10:00:05.0    Subject:    RE:New Refill Request    Message:    [name],        I sent in the ProAir and Ibuprofen. I'll be in the office tomorrow and will  sign your Ativan RX and we'll fax it in. Stay safe.  [name]                    ---          * * *          Patient: [name] DOB: September 03, 1975 Provider:  [name] 09/25/2018    ---    Note generated by eClinicalWorks EMR/PM Software ([email or web address])

## 2018-10-13 ENCOUNTER — Ambulatory Visit (HOSPITAL_BASED_OUTPATIENT_CLINIC_OR_DEPARTMENT_OTHER): Admitting: Family Medicine

## 2018-10-13 NOTE — Progress Notes (Signed)
* * *        [name]**    ------    43 Y old Female, DOB: 09-11-1975    Account Number: [id number]    [address] [place], [place]    Home: [phone]    Guarantor: [name] Insurance: [employer]: HMO BLUE NEW [place] - OPTIONS  Payer ID: [id number]    PCP: Karlyne Greenspan, JR External Visit ID: [id number]    Appointment Facility: [facility]        * * *    10/13/2018    Progress Notes: Shylyn Younce    ------    ---        **Current Medications**    ---    Taking      * Zyrtec 10 mg tablet 1 tab(s) orally once a day     ---    * Astelin 137 mcg/inh spray 2 spray(s) intranasally 2 times a day     ---    * DULoxetine Hydrochloride 60 mg delayed release capsule 1 cap(s) orally once a day     ---    * omeprazole 20 mg delayed release capsule 1 cap(s) orally once a day     ---    * gabapentin 300 mg capsule 1 cap(s) orally 3 times a day     ---    * Celexa 20 mg tablet TAKE ONE TABLET BY MOUTH DAILY     ---    * cyclobenzaprine 10 mg tablet 1 tab(s) orally HS, can increase up to 3 times a day     ---    * Fioricet 325 mg-50 mg-40 mg tablet 2 tab(s) orally Q4H maximum 6 per day prn     ---    * albuterol 90 mcg/inh aerosol 2 puff(s) inhaled every 6 hours     ---    * prednisone 50 mg tablet 1 tab(s) orally once a day     ---    * ProAir HFA CFC free 90 mcg/inh aerosol with adapter 2 puff(s) inhaled 20-30 min before exercise     ---    * Ativan 0.5 mg tablet 1 tab(s) orally HS, prn, Notes: DX: F41.8     ---    * ibuprofen 800 mg tablet 1 tab(s) orally 3 times a day     ---      **Social History**    ---    no Tobacco Use  Status: never smoker  , Patient counseled on the dangers of  tobacco use: 08/16/2018.    Alcohol: socially.    Marital Status: married, "awesome".    Children: 2, son, [name], 97 yrs and daughter, [name] 15 yrs.    Sexually active: yes.    Religion: Catholic.    Home smoke detector use: yes.    Pets: yes, 1 dog.    no Drug use, CBD oil on her back, helps.    Exercise: yes,  walks a lot, difficult w/back pain.    Caffeine: yes, 2 cups coffee per day.    no Domestic violence.    Diet/Nutrition: "gained the freshman 40," and more thereafter, daily yogurt,  no supplements, daily fruits and vegetables, whole grains, problem is portion  sizes and snacking/picking.    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    Number of household members: 5, self, spouse, 2 children,and a 4yo foster  child.    Seat belt: yes.    no Travel outside [place].  Sexual orientation: heterosexual.      **Review of Systems**    ---    _CONSTITUTIONAL_ :    no Fever. no Chills. no Sweats. no Loss of Appetite. c/o Fatigue.    _OPTHALMOLOGY_ :    no Change in Vision.    _RESPIRATORY_ :    no Shortness of Breath. no DOE (dyspnea on exertion). no Persistent Cough.    _CARDIOLOGY_ :    no Chest Pain. no Shortness of Breath. no Palpitations. no Dizziness.    _GASTROENTEROLOGY_ :    no Abdominal Pain. no Nausea. no Vomiting.    _NEUROLOGY_ :    no Headaches.    _DERMATOLOGY_ :    no Rash.            **Reason for Appointment**    ---      1\. telephone visit, [phone], pt of RL, dtr tested positive for COVID  yesterday, pt has cough/ST since Saturday, needs to be tested    ---      **History of Present Illness**    ---    _Sick Visit_ :    This is a telemedicine audio-only performed for the evaluation and management  of chief complaint listed upon recommendations made by various state agencies  including CDC and [place] Department of Public Health due to high risk transmission  of COVID 19. The logistics of the visit as well as there maybe financial  obligations related to the appointment were discussed. Verbal consent  obtained. Patient is at remote location and provider at the office.    43 year old complains of cough and sorethroat that began 3 days ago.  Associated with body aches. No fever and chills. Her daughter tested positive  for COVID yesterday. She states she works at [employer] and was  scheduled for COVID  testing tomorrow.      **Assessments**    ---    1\. Upper respiratory infection - J06.9 (Primary)    ---    2\. Exposure to COVID-19 virus - Z20.828    ---      **Treatment**    ---      **1\. Upper respiratory infection**    Notes:    Could be viral in nature vs COVID 19. Scheduled for COVID testing given her  exposure by occupational health at [employer]. Supportive care. Follow COVID CDC  guidelines.    .    ---        **2\. Exposure to COVID-19 virus**    Notes: As above.      **Follow Up**    ---    prn    Electronically signed by Peggye Form on 10/13/2018 at 12:47 PM EDT    Sign off status: Completed        * * [facility]    [address] [place], [place] [address]    Tel: [phone]    Fax: [phone]              * * *          Patient: [name] DOB: 1975/11/02 Progress Note: Eshan Trupiano  10/13/2018    ---    Note generated by eClinicalWorks EMR/PM Software ([email or web address])

## 2018-10-14 ENCOUNTER — Ambulatory Visit: Admitting: General Acute Care Hospital

## 2018-10-15 ENCOUNTER — Ambulatory Visit (HOSPITAL_BASED_OUTPATIENT_CLINIC_OR_DEPARTMENT_OTHER)

## 2018-10-15 LAB — HX COVID19 (~~LOC~~): CASE NUMBER: 2020100000734

## 2018-10-21 ENCOUNTER — Ambulatory Visit (HOSPITAL_BASED_OUTPATIENT_CLINIC_OR_DEPARTMENT_OTHER)

## 2018-10-21 NOTE — Progress Notes (Signed)
* * *        [name]**    ------    43 Y old Female, DOB: 1975/08/20    Account Number: [id number]    [address], [place], [place]    Home: [phone]    Guarantor: [name] Insurance: [employer]: HMO BLUE NEW [place] - OPTIONS    PCP: Karlyne Greenspan, JR    Appointment Facility: [facility]        * * *    10/21/2018    Progress Notes: [name], NP    ------    ---        **Reason for Appointment**    ---      1\. 6 m f/u med. extented visit - video visit    ---    2\. Pt was at home, provider was in the office    ---      **History of Present Illness**    ---    _Follow-up_ :    Televisit for 6 month f/u of chronic issues. Working remotely. She takes care  of parents who live downstairs. Struggling w/depression and anxiety. Was  taking ativan but as of last Thursday getting bad headaches. Taking 1/2  flexeril and ativan BID. Tried tylenol, fioricet, excedrin. Once she tested  neg for Covid started Motrin 800. Nothing touching it. Feels like an elastic  around her head. BP is OK. 110/70, max 138/65. Was nauseous. Had 2 days of  misery that has broken, but now getting milder headaches on and off thorughout  the day. No change in caffeine intake. Tough working at home. She has 3 kids  74, 74 and 33 yo. plus parents downstairs. Neice was living with her and she  tested positive for Covid. She was working at a nursing home.    [facility] Covid testing was negative.    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 PAP/HPV -  Date done - 08/02/14  , Neg,  UTD  ,  Repeat due  - 07/2019  , but MRI indicated hypodense cervix and recommended PAP, repeat  04/2018 neg/neg, UTD  . Mammogram -  Date done - 06/23/18  ,  Normal  ,  Up to  date  . Immunizations  Tdap 10/18, flu shot 10/19  . Contraception  Mirena  IUD, replaced 2016,  "fine"  .      **Current Medications**    ---    Taking      * Zyrtec 10 mg tablet 1 tab(s) orally once a day     ---    * omeprazole 20 mg delayed release capsule 1 cap(s) orally once a day     ---    * Celexa 20 mg tablet TAKE ONE TABLET BY MOUTH DAILY     ---    * cyclobenzaprine 10 mg tablet 1 tab(s) orally HS, can increase up to 3 times a day     ---    * Fioricet 325 mg-50 mg-40 mg tablet 2 tab(s) orally Q4H maximum 6 per day prn     ---    * albuterol 90 mcg/inh aerosol 2 puff(s) inhaled every 6 hours     ---    * ProAir HFA CFC free 90 mcg/inh aerosol with adapter 2 puff(s) inhaled 20-30  min before exercise     ---    * Ativan 0.5 mg tablet 1 tab(s) orally HS, prn, Notes: DX: F41.8     ---    * ibuprofen 800 mg tablet 1 tab(s) orally 3 times a day     ---    * Astelin 137 mcg/inh spray 2 spray(s) intranasally 2 times a day     ---    Discontinued    * gabapentin 300 mg capsule 1 cap(s) orally 3 times a day     ---    * prednisone 50 mg tablet 1 tab(s) orally once a day     ---    * DULoxetine Hydrochloride 60 mg delayed release capsule 1 cap(s) orally once a day     ---      **Examination**    ---    _General Examination_ :    General  appropriate attire and affect, obese, well-hydrated, not tearful  ,  speech normal in rate, tone and volume  ,  good eye contact  ,  thought  processes logical  .    Video visit: not examined.          **Assessments**    ---    1\. Depression with anxiety - F41.8 (Primary)    ---    2\. 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 Acne - L70.9    ---    4\. Migraine headache - G43.909    ---    5\. 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 Allergic rhinitis - J30.9    ---    7\. GERD (gastroesophageal reflux disease) - K21.9    ---    8\. Chronic low back pain - M54.5    ---    9\. Obesity (BMI 30.0-34.9) - E66.9    ---    10\. BMI 33.0-33.9,adult - Z68.33    ---      **Treatment**    ---      **1\. Depression with anxiety**    Continue Celexa tablet,  20 mg, TAKE ONE TABLET BY MOUTH DAILY    Continue Ativan tablet, 0.5 mg, 1 tab(s), orally, HS, prn, 30 days, Refills 0    Stop DULoxetine Hydrochloride delayed release capsule, 60 mg, 1 cap(s),  orally, once a day    Notes:  Duloxetine worsened her headaches so she went back to Celexa. Very  stressed but wants to stay on current dose. Takes Ativan prn. RX 3/31 30  pills, still has 22 left. Doesn't need refills of Celexa. Managing despite the  stresses in her life  .    ---        **2\. Exercise-induced asthma**    Continue ProAir HFA aerosol with adapter, CFC free 90 mcg/inh, 2 puff(s),  inhaled, 20-30 min before exercise    Notes:  Rarely needs inhaler  .        **3\. Acne**    Notes:  Stable. Treats w/OTCs  .        **4\. Migraine headache**    Continue Fioricet tablet, 325 mg-50 mg-40 mg, 2 tab(s), orally, Q4H maximum 6  per day prn    Notes:  She is getting both her migraines and tension headaches. She used to  see neuro for migraines and was on Elavil for a short time. She really doesn't  want to restart. She finds that taking 5 mg Flexeril BID helps the  tension  headaches. She questioned if she could start Imitrex but there is a major  interaction w/Celexa. Will have her continue OTCs, Fioricet, etc. Could  consider Topamax  .        **5\. Attention deficit disorder of adult**    Notes:  Not currently on meds. Managing on her own  .        **6\. Allergic rhinitis**    Continue Zyrtec tablet, 10 mg, 1 tab(s), orally, once a day    Continue Astelin spray, 137 mcg/inh, 2 spray(s), intranasally, 2 times a day    Notes:  Seasonal. Have been bad lately. Taking Allegra and Flonase  .        **7\. GERD (gastroesophageal reflux disease)**    Refill omeprazole delayed release capsule, 20 mg, 1 cap(s), orally, once a  day, 90 days, 90, Refills 3    Notes:  Stable  .        **8\. Chronic low back pain**    Continue ibuprofen tablet, 800 mg, 1 tab(s), orally, 3 times a day    Continue cyclobenzaprine tablet, 10 mg, 1  tab(s), orally, HS, can increase up  to 3 times a day    Notes:  Back is better. She went to [facility] and had Botox in her piriformis  muscle and it's been amazing. Dr. Rance Muir, physiatrist. He sent her to another  doc who did the injection. Pain went from a 10 to a 3  .        **9\. Obesity (BMI 30.0-34.9)**    Notes:  Encouraged diet and exercise. She has a strong FH cardiac disease. Has  gained 10-15 lb since she's been home working remotely. Once she is done w/her  quarantine, should get out and walk more  .        **10\. BMI 33.0-33.9,adult**    Notes:  As above  .        **11\. Others**    Clinical Notes: Video/audio visit. Total visit time 28 min of which greater  than 50% was spent in counseling and education including discussion, answering  questions and planning treatment on the issues documented above.      **Follow Up**    ---    CPE in October, sooner prn    Electronically signed by [name] , RNCFNP on 11/02/2018 at 01:03 PM EDT    Sign off status: Completed        * * [facility]    [address] [place], [place] [address]    Tel: [phone]    Fax: [phone]              * * *          Patient: BREYA, CASS DOB: 13-Jun-1976 Progress Note: [name], NP  10/21/2018    ---    Note generated by eClinicalWorks EMR/PM Software ([email or web address])

## 2018-10-21 NOTE — Progress Notes (Signed)
* * *        **  [name]**    ------    57 Y old Female, DOB: 04-05-1976    [address], [place], [place] [address]    Home: [phone]    Provider: [name]        * * *    Web Encounter    ---    Answered by    [name]    Date: 10/21/2018        Time: 09:23 AM    Caller    KP    ------            Reason    Imitrex                * * *        **eMessages**   FromDenna Haggard    ------    Created:    2018-10-21 09:28:21.0    Sent:      Subject:    [id number]    Message:    [name],        I checked and there is a "major" interaction with Celexa and Imitrex. I  believe this is a class effect with all Triptans. Amitryptyline also has some  interactions as well. Another option is topiramate. It is used for  migraine/headache prevention and may help your weight. Let me know if you want  to try it or give it more time first.        [name]                    ---          * * *          Patient: [name] DOB: August 08, 1975 Provider: [name] 10/21/2018    ---    Note generated by eClinicalWorks EMR/PM Software ([email or web address])

## 2018-10-26 ENCOUNTER — Ambulatory Visit (HOSPITAL_BASED_OUTPATIENT_CLINIC_OR_DEPARTMENT_OTHER)

## 2018-10-26 NOTE — Progress Notes (Signed)
* * *        **  [name]**    ------    78 Y old Female, DOB: 04-22-1976    [address] [place], [place] [address]    Home: [phone]    Provider: [name]        * * *    Web Encounter    ---    Answered by    Staff, Clinical    Date: 10/26/2018        Time: 10:06 AM    Caller    [name]    ------            Reason    Topamax            Message                      Addressed To [name],      I'd be willing to try Topamax headaches are still bad.             My world was shattered this weekend my mom passed away suddenly on June 16, 2023 by an MI. This was my worst fear and it has happened. I'm struggling but have my family and friends to support me.                       Refills    Start Topamax tablet, 50 mg, orally, 60, 1 tab(s), 2 times a day,  30 day(s)    ------          * * *        **eMessages**   From:    [name]    ------    Created:    2018-10-26 12:43:29.0    Sent:      Subject:    [id number]    Message:    Kennedy Bucker, I am so sorry. I don't know what to say. This has to be  so incredibly hard for you. Please know you're in my thoughts.        I sent in an RX for topamax. It's twice a day but start with once at night for  awhile. You can eventually increase to twice a day if it helps. I only sent in  one month so you can see how you do.        Again, my sympathies to you and your family.        [name]                    ---          * * *          Patient: [name] DOB: [birth year] Provider: [name] 10/26/2018    ---    Note generated by eClinicalWorks EMR/PM Software ([email or web address])

## 2018-12-14 ENCOUNTER — Ambulatory Visit (HOSPITAL_BASED_OUTPATIENT_CLINIC_OR_DEPARTMENT_OTHER)

## 2018-12-14 NOTE — Progress Notes (Signed)
* * *        [name]**    ------    14 Y old Female, DOB: [birth year]    [address] [place], [place] [address]    Home: [phone]    Provider: [name]        * * *    Web Encounter    ---    Answered by    Staff, Clinical    Date: 12/14/2018        Time: 11:46 AM    Caller    [name]    ------            Reason    New Refill Request            Message                      Addressed To        ***Start of Medication List: ***       ProAir HFA      CFC free 90 mcg/inh      2 puff(s)      20-30 min before exercise      ,      Celexa      20 mg      TAKE ONE TABLET BY MOUTH DAILY            ,      omeprazole      20 mg      1 cap(s)      once a day      ,      ibuprofen      800 mg      1 tab(s)      3 times a day       ***End of Med List ***         Comments : ok to wait for [name]       Please increase the dose of celexa to 40 mg and also I've been taking 40mg  of Prilosec as well. Between stress and poor diet my acid reflex has been terrible. I am working on getting back on track one step at a time.                 Action Taken                      [name], Charity fundraiser ,[name] 12/14/2018 11:58:10 AM > See pt notes on Celexa and Prilosec. TV 10/21/2018.      Journe Hallmark B 12/14/2018 12:47:11 PM > RXes sent as requested. Pt sent me a message on [facility] email stating she is now seeing a therapist as well.                Refills    Refill albuterol aerosol, 90 mcg/inh, inhaled, 1, 2 puff(s), every  6 hours, 30 day(s)    ------      Refill ibuprofen tablet, 800 mg, orally, 100, 1 tab(s), 3 times a day prn      Increase omeprazole delayed release capsule, 40 mg, orally, 90, 1 cap(s),  once a day, 90 days      Increase Celexa tablet, 40 mg, orally, 90, TAKE ONE TABLET BY MOUTH DAILY,  once a day, 90 days          * * *        **eMessages**   From:    Nastasia Kage    ------  Created:    2018-12-14 12:47:00.0    Sent:      Subject:    RE:New Refill Request    Message:    [name],        I  sent in all your refills as per your request. Glad to hear you are starting  to see a therapist. Hope to see you in October at your physical. Let's hope  things become more normalized by then. Take care.        [name]                    ---          * * *          Patient: [name] DOB: [birth year] Provider: [name] 12/14/2018    ---    Note generated by eClinicalWorks EMR/PM Software ([email or web address])

## 2019-02-04 ENCOUNTER — Ambulatory Visit: Admit: 2019-02-04 | Payer: HMO

## 2019-02-04 ENCOUNTER — Ambulatory Visit: Admitting: Physical Medicine & Rehabilitation

## 2019-02-04 NOTE — Progress Notes (Signed)
 * * *    [name], [name] **DOB:** 1976-03-06 (43 yo F) **Acc No.** [id number] **DOS:**  02/04/2019    ---       [name], [name]**    ------    43 Y old Female, DOB: 12/30/1975, External MRN: [id number]    Account Number: [id number]    [address] [place], [place]    Home: [phone]    Insurance: HMO [place] [employer] Payer ID: PAPER    PCP: Dr. Katheran Awe, MD Referring: Dr. Katheran Awe, MD  External Visit ID: [id number]    [facility]ointment [facility]        * * *    02/04/2019 Progress Notes: [name] LatCHN#:** [id number]    ------    ---       **Current Medications**    ---    Taking    * Allegra 180 mg as needed    ---    * Celexa 20 mg     ---    * Flexeril 10 mg 10 mg Tablets one tablet orally every 8 hours prn pain    ---    * Motrin 800 mg as needed    ---    * Omeprazole 20 MG Capsule Delayed Release 1 capsule 30 minutes before morning meal Orally Once a day    ---    Discontinued    * GABAPENTIN 100 MG CAPSULE 100 mg tab orally TID    ---     Past Medical History    ---      Atypical facial nerve pain.        ---      **Surgical History**    ---      Tonsillectomy    ---     **Family History**    ---      No family history of dystonia. Mother's brother with diagnosis of MS.    ---      **Social History**    ---    Tobacco    history: _Never smoked_      **Allergies**    ---      Codeine Sulfate    ---    [Allergies Verified]      **Review of Systems**    ---    Gen: No fever, chills,    HEENT: No headache    CV: No chest pain    Pulm: no shortness of breath    GI: No N/V/D, no bowel incontinence    GU: No urinary incontinence    Neuro: No weakness    Skin: no rash.       **History of Present Illness**    ---     _[facility]_:    Ms. [name] is a 43 year old F with PMH right sided sciatica and piriformis  syndrome (s/p multiple injections and RFA, as well as botox injection by Dr.  Jacquiline [name] 07/12/18), who presents for follow-up.  She reports relief after he botox  injection, per prior note 85% relief, but at that time she had separate hip  pain that was thought to be greater trochanteric burtsitis that was the reason  for referral to my clinic today. Due to the pandemic she was not able to be  seen earlier in the spring, and also she was unable to attend her PT sessions  (only able to go to 1-2 session).    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 She  describes two types of pain. The first pain is a right buttock pain that  iproved after the botox. After her botox injection, her aching improved but  still had pain that radiated to anterior hip and down thigh, recently in last  serveral months she feels that it now radiates down to her foot and that she  has felt more gerneralized weakness and fatigue but not noticeable right leg  weakness per se. She has no relief from either sitting or standing.    Pain stable on current regimen of flexeril (helps her sleep when spasms are  worse, usually with mestrual cycles), Celexa and Motrin. She occasionally uses  ice, stretching    Past treatment as per prior notes:    "In regards to prior history she had right buttock injections from Dr. Nona [name] in  [facility]; a physiatrist, which seemed to be steroid injection into the  right piriformis and sciatic notch. These helped for about 6 weeks, the last  one was in April 2019, but then she had full return of the pain. ".      **Vital Signs**    ---    Pain scale 6, Ht-in 62, Wt-lbs 189 per pt.      **Physical Examination**    ---     _GENERAL_ :    General Appearance:  well developed but overweight, no acute distress.    Mood/Affect: pleasant.    Skin Normal.    Cardiovascular extremities warm without swelling.    Lungs unlabored breathing on room air .    MSK/neuro:    normal ROM to lateral and forward/backward bending    no paraspinal tenderness    TTP of right piriformis that reproduced radiating pain    TTP right  greater trochanter, no TTP of ITB band    negative Ober test    Negative SLT, crossed SLT, femoral stretch, FABER and FADIR bilaterally    5/5 LE strength    diminished sensation to pinprick of right L4, L5 and S1    Right patellar reflex 2 and left 1    achilles reflex 1 bilaterally.      **Assessments**    ---    1\. Trochanteric bursitis of right hip - M70.61 (Primary)    ---    2\. Piriformis syndrome of right side - G57.01    ---     [name] is a 43 year old who presents for f/u after injection of 250 Units  Dysport to right piriforms for suspected right piriformis syndrome. She has  had significant improvement in pain over right buttocks and improvement in  pain radiating down right leg; she states about 85% improvement in the pain in  total. She continues to have some pain over right greater trochanter that  seems consistent with possible trochanteric bursitis.    ---      **Treatment**    ---      **1\. Trochanteric bursitis of right hip**    Notes: Recommend injection to right trochanteric bursa under imaging.    Clinical Notes: Recommend returning to Dr. Jacquiline [name] for repeat botox injection  to right piriformis. At this time it appears that her new sensory change is  likely due to sciatic nerve compression from her piriformis thus for now will  not pursue any additional imaging to evaluate lumbar spine pathology.    ---     **Follow Up**    ---    2 weeks post  injection    Electronically signed by [name] , MD on 02/11/2019 at 08:23 AM EDT    Sign off status: Completed        * * *        [facility]    [address]    [address]    [place], [place] [address]    Tel: [phone]    Fax: [phone]              * * *          Progress Note: [name] 02/04/2019    ---    Note generated by eClinicalWorks EMR/PM Software ([email or web address])

## 2019-02-04 NOTE — Progress Notes (Signed)
 .  Progress Notes  .  Patient: [name], [name]  Provider: Debera Lat  DOB: [birth year] Age: 43 Y Sex: Female  .  PCP: Katheran Awe MD  Date: 02/04/2019  .  --------------------------------------------------------------------------------  .  HISTORY OF PRESENT ILLNESS  .  [facility]:  [name] is a  43 year old F with PMH right sided sciatica and piriformis  syndrome (s/p multiple injections and RFA, as well as botox  injection by Dr. Jacquiline Doe 07/12/18), who presents for follow-up. She  reports relief after he botox injection, per prior note 85%  relief, but at that time she had separate hip pain that was  thought to be greater trochanteric burtsitis that was the reason  for referral to my clinic today. Due to the pandemic she was not  able to be seen earlier in the spring, and also she was unable to  attend her PT sessions (only able to go to 1-2 session). 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 She describes two types of pain. The first pain is a  right buttock pain that iproved after the botox. After her botox  injection, her aching improved but still had pain that radiated  to anterior hip and down thigh, recently in last serveral months  she feels that it now radiates down to her foot and that she has  felt more gerneralized weakness and fatigue but not noticeable  right leg weakness per se. She has no relief from either sitting  or standing. Pain stable on current regimen of flexeril (helps  her sleep when spasms are worse, usually with mestrual cycles),  Celexa and Motrin. She occasionally uses ice, stretchingPast  treatment as per prior notes:"In regards to prior history she had  right buttock injections from Dr. Nona Dell in [facility]; a  physiatrist, which seemed to be steroid injection into the right  piriformis and sciatic notch. These helped for about 6 weeks, the  last one was in April 2019, but then she had full return of  the  pain. ".  .  CURRENT MEDICATIONS  .  Taking Allegra 180 mg as needed  Taking Celexa 20 mg  Taking Flexeril 10 mg 10 mg Tablets one tablet orally every 8  hours prn pain  Taking Motrin 800 mg as needed  Taking Omeprazole 20 MG Capsule Delayed Release 1 capsule 30  minutes before morning meal Orally Once a day  Discontinued GABAPENTIN 100 MG CAPSULE 100 mg tab orally TID  .  PAST MEDICAL HISTORY  .  Atypical facial nerve pain  .  ALLERGIES  .  Codeine Sulfate  .  SURGICAL HISTORY  .  Tonsillectomy  .  FAMILY HISTORY  .  No family history of dystonia. Mother's brother with diagnosis of  MS.  .  SOCIAL HISTORY  .  .  Tobacco  history:Never smoked  .  REVIEW OF SYSTEMS  .  Gen: No fever, chills,HEENT: No headacheCV: No chest painPulm: no  shortness of breathGI: No N/V/D, no bowel incontinenceGU: No  urinary incontinenceNeuro: No weaknessSkin: no rash.  Marland Kitchen  VITAL SIGNS  .  Pain scale 6, Ht-in 62, Wt-lbs 189 per pt.  .  PHYSICAL EXAMINATION  .  GENERAL:  General Appearance:  well developed but overweight, no acute  distress.  Mood/Affect:  pleasant.  Skin  Normal.  Cardiovascular  extremities warm without swelling.  Lungs  unlabored breathing on room air .  MSK/neuro:normal ROM to lateral and forward/backward bending no  paraspinal tendernessTTP of right piriformis that reproduced  radiating painTTP right greater trochanter, no TTP of ITB band  negative Ober testNegative SLT, crossed SLT, femoral stretch,  FABER and FADIR bilaterally5/5 LE strengthdiminished sensation to  pinprick of right L4, L5 and S1Right patellar reflex 2 and left  1achilles reflex 1 bilaterally.  .  ASSESSMENTS  .  Trochanteric bursitis of right hip - M70.61 (Primary)  .  Piriformis syndrome of right side - G57.01  .  Ardell is a 43 year old who presents for f/u after injection of 250  Units Dysport to right piriforms for suspected right piriformis  syndrome. She has had significant improvement in pain over right  buttocks and improvement in pain  radiating down right leg; she  states about 85% improvement in the pain in total. She continues  to have some pain over right greater trochanter that seems  consistent with possible trochanteric bursitis.  Marland Kitchen  TREATMENT  .  Trochanteric bursitis of right hip  Notes: Recommend injection to right trochanteric bursa under  imaging.  Clinical Notes: Recommend returning to Dr. Jacquiline Doe for repeat  botox injection to right piriformis. At this time it appears that  her new sensory change is likely due to sciatic nerve compression  from her piriformis thus for now will not pursue any additional  imaging to evaluate lumbar spine pathology.  .  FOLLOW UP  .  2 weeks post injection  .  Electronically signed by Jerilee Field , MD on  02/11/2019 at 08:23 AM EDT  .  Document electronically signed by Debera Lat

## 2019-02-10 ENCOUNTER — Ambulatory Visit (HOSPITAL_BASED_OUTPATIENT_CLINIC_OR_DEPARTMENT_OTHER)

## 2019-02-10 NOTE — Progress Notes (Signed)
* * *        [name]**    ------    28 Y old Female, DOB: 01/18/1976    Account Number: [id number]    [address] [place], [place]    Home: [phone]    Guarantor: [name] Insurance: [employer]: HMO BLUE NEW  - OPTIONS    PCP: Karlyne Greenspan, JR    Appointment Facility: [facility]        * * *    02/10/2019    Progress Notes: [name], NP    ------    ---        **Reason for Appointment**    ---      1\. Telephone visit, [phone], chronic sciatica, asking for steroids  until she can get injection approved    ---    2\. Phone Televisit, pt at home (during Covid pandemic), provider in the  office    ---      **History of Present Illness**    ---    _Sick Visit_ :    Her back really hurts. Saw Dr. Jonathon Bellows last Friday. Needs a cortisone injection  into her trochanter and a botox into her piriformis. Waiting for insurance to  approve it. Because of Covid she didn't get to have her second injections on  time. She would like a steroid taper in the interim as she is miserable. Both  her back and the piriformis are causing pain.    _TeleVisit Consent_ :    Consent: I introduced and identified myself, received verbal consent from the  patient to proceed with this video and/or telephone visit and made the patient  aware that the same confidentiality and information security practices apply.  The patient also understands there may be a subsequent co-pay or payment asked  of him/her. **Yes** . Patient Verification: I verified the patient's name and,  date of birth, and as well as payer information ID if available. **Yes** .  Patient Location: I verified the patient's location: **Work** . Physician  Location: I verified my location: **Home** . Visit Conducted By: This visit  was conducted by: **Telephone Only** .      **Current Medications**    ---    Taking      * ProAir HFA CFC free 90 mcg/inh aerosol with adapter 2 puff(s) inhaled 20-30 min before exercise     ---     * Zyrtec 10 mg tablet 1 tab(s) orally once a day     ---    * Astelin 137 mcg/inh spray 2 spray(s) intranasally 2 times a day     ---    * Fioricet 325 mg-50 mg-40 mg tablet 2 tab(s) orally Q4H maximum 6 per day prn     ---    * cyclobenzaprine 10 mg tablet 1 tab(s) orally HS, can increase up to 3 times a day     ---    * Celexa 20 mg tablet TAKE ONE TABLET BY MOUTH DAILY     ---    * Ativan 0.5 mg tablet 1 tab(s) orally HS, prn     ---    * Topamax 50 mg tablet 1 tab(s) orally 2 times a day     ---    * albuterol 90 mcg/inh aerosol 2 puff(s) inhaled every 6 hours     ---    * ibuprofen 800 mg tablet 1 tab(s) orally 3 times a day prn     ---    *  omeprazole 40 mg delayed release capsule 1 cap(s) orally once a day     ---    * Celexa 40 mg tablet TAKE ONE TABLET BY MOUTH DAILY orally once a day     ---      **Physical Examination**    ---    Phone visit, not examined.      **Assessments**    ---    1\. Chronic low back pain - M54.5 (Primary)    ---    2\. Trochanteric bursitis, unspecified laterality - M70.60    ---      **Treatment**    ---      **1\. Chronic low back pain**    Start PREDNISONE TABLET, 10 MG, 6 TABS QD X 2 DAYS THEN DECREASE BY 1 TAB  EVERY 2 DAYS, PO, 12 DAYS, 42, Refills 0    Notes: She is waiting for approval for botox injection into piriformis and she  is miserable. Will go ahead and give her a pred taper. Advised to take  prednisone on a full stomach earlier in the day, avoid Ibuprofen (Advil,  Motrin) or Naproxen (Aleve). Can take acetaminophen (Tylenol) for pain.    ---        **2\. Trochanteric bursitis, unspecified laterality**    Notes: Awaiting clearance from insurance for cortisone injection from Dr.  Jonathon Bellows.        **3\. Others**    Clinical Notes: Phone visit. Total visit time 12 min of which greater than 50%  was spent in counseling and education including discussion, answering  questions and planning treatment on the issues documented above.      **Follow Up**    ---    F/U  any worsening or persistent symptoms.    Electronically signed by [name] , RNCFNP on 02/10/2019 at 10:17 AM EDT    Sign off status: Completed        * * [facility]    [address] [place], [place] [address]    Tel: [phone]    Fax: [phone]              * * *          Patient: [name], [name] DOB: [birth year] Progress Note: [name], NP  02/10/2019    ---    Note generated by eClinicalWorks EMR/PM Software ([email or web address])

## 2019-03-01 ENCOUNTER — Ambulatory Visit

## 2019-03-01 NOTE — Progress Notes (Signed)
* * *      Bogle, Kenedie **DOB:** [birth year] (42 yo F) **Acc No.** [id number] **DOS:**  03/01/2019    ---       [name], [name]**    ------    26 Y old Female, DOB: 1976-05-07    [address] [place], [place] [address]    Home: [phone]    Provider: Carola Frost        * * *    Telephone Encounter    ---    Answered by  Inocencio Homes Date: 03/01/2019       Time: 11:00 AM    Reason  Dysport, Appt: 08/31    ------            Message                     08/31 - Botox is covered under the medical benefits.       08/31 - Botox Lot# [id number], Exp: 09/2021, 1 Vial                    * * *                ---          * * *         Provider: Carola Frost 03/01/2019    ---    Note generated by eClinicalWorks EMR/PM Software ([email or web address])

## 2019-03-07 ENCOUNTER — Ambulatory Visit

## 2019-03-07 ENCOUNTER — Ambulatory Visit: Admit: 2019-03-07 | Payer: HMO

## 2019-03-07 NOTE — Progress Notes (Signed)
 * * *    [name], [name] **DOB:** 10/16/1975 (42 yo F) **Acc No.** [id number] **DOS:**  03/07/2019    ---       [name], [name]**    ------    49 Y old Female, DOB: 03-14-1976, External MRN: [id number]    Account Number: [id number]    [address], [place], [place]    Home: [phone]    Insurance: HMO [place] OUT IPA Payer ID: PAPER    PCP: Dr. Katheran Awe, MD Referring: Dr. Katheran Awe, MD  External Visit ID: [id number]    Appointment Facility: [facility]        * * *    03/07/2019 Progress Notes: Carola Frost, MD **CHN#:** [id number]    ------    ---       **Current Medications**    ---    Taking    * Allegra 180 mg as needed    ---    * Celexa 20 mg     ---    * Flexeril 10 mg 10 mg Tablets one tablet orally every 8 hours prn pain    ---    * Motrin 800 mg as needed    ---    * Omeprazole 20 MG Capsule Delayed Release 1 capsule 30 minutes before morning meal Orally Once a day    ---     Past Medical History    ---      Atypical facial nerve pain.        ---      **Surgical History**    ---      Tonsillectomy    ---     **Family History**    ---      No family history of dystonia. Mother's brother with diagnosis of MS.    ---      **Social History**    ---    Tobacco history: Never smoked.     **Allergies**    ---      Codeine Sulfate    ---       **Reason for Appointment**    ---      1\. Botox/100 units    ---      **History of Present Illness**    ---     _[facility]_:    [name] is here for a repeat Botox injection to the R pirifromis. She reports the  previous injection in January lasted through April but has since worn off. She  denies adverse reactions. We will use Botox instead of Dysport.      **Vital Signs**    ---    Ht-in 62, Wt-lbs 189, BMI 34.56, BSA 1.93, Wt-kg 85.73, Wt Change 4 lb.      **Assessments**    ---    1\. 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 Other dystonia**     Notes:    Risks and benefits of Botox Intramuscular Neurolysis were discussed with the  patient, and informed consent was obtained.    PROCEDURE:  The patient was positioned in optimal position for administration  of the Botox. The skin was cleaned with alcohol at each injection site. Using  a 27 gauge Teflon coated injection needle and EMG guidance, the following  muscles were localized and injected with the specified amounts of Botox, with  bloodless aspirate noted prior to each  injection:        Right piriformis: 100 units    Needle electromyography demonstrated marked motor unit activity at rest in  each of the above muscles. The patient tolerated the procedure well.    Effect of the Botox should be evident within 5-7 days with full benefit by 2-3  weeks. She'll call when it is wearing off.    ---     **Procedure Codes**    ---      95874 EMG GUIDANCE FOR CHEMODENERVATION    ---    16109 CHEMODENERV 1 EXTREMITY 1-4    ---    U0454 BOTULINUM TOXIN TYPE A PER UNIT, Units: 100.00    ---     **Follow Up**    ---    prn    Electronically signed by Carola Frost , MD on 03/07/2019 at 10:23 AM EDT    Sign off status: Completed        * * *        [facility]    [address]    [address]    [place], [place] [address]    Tel: [phone]    Fax: [phone]              * * *          Progress Note: Carola Frost, MD 03/07/2019    ---    Note generated by eClinicalWorks EMR/PM Software ([email or web address])

## 2019-03-07 NOTE — Progress Notes (Signed)
.    Progress Notes  .  Patient: [name], [name]  Provider: Carola Frost    .  DOB: March 04, 1976 Age: 43 Y Sex: Female  .  PCP: Katheran Awe MD  Date: 03/07/2019  .  --------------------------------------------------------------------------------  .  REASON FOR APPOINTMENT  .  1. Botox/100 units  .  HISTORY OF PRESENT ILLNESS  .  [facility]:   Ginette is here for a repeat Botox injection to the R pirifromis.  She reports the previous injection in January lasted through  April but has since worn off. She denies adverse reactions. We  will use Botox instead of Dysport.  .  CURRENT MEDICATIONS  .  Taking Allegra 180 mg as needed  Taking Celexa 20 mg  Taking Flexeril 10 mg 10 mg Tablets one tablet orally every 8  hours prn pain  Taking Motrin 800 mg as needed  Taking Omeprazole 20 MG Capsule Delayed Release 1 capsule 30  minutes before morning meal Orally Once a day  .  PAST MEDICAL HISTORY  .  Atypical facial nerve pain  .  ALLERGIES  .  Codeine Sulfate  .  SURGICAL HISTORY  .  Tonsillectomy  .  FAMILY HISTORY  .  No family history of dystonia. Mother's brother with diagnosis of  MS.  .  SOCIAL HISTORY  .  .  Tobaccohistory:Never smoked.  Marland Kitchen  VITAL SIGNS  .  Ht-in 62, Wt-lbs 189, BMI 34.56, BSA 1.93, Wt-kg 85.73, Wt Change  4 lb.  .  ASSESSMENTS  .  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  TREATMENT  .  Other dystonia  Notes:  .  Risks and benefits of Botox Intramuscular Neurolysis were  discussed with the patient, and informed consent was obtained.  Marland Kitchen  PROCEDURE: The patient was positioned in optimal position for  administration of the Botox. The skin was cleaned with alcohol at  each injection site. Using a 27 gauge Teflon coated injection  needle and EMG guidance, the following muscles were localized and  injected with the specified amounts of Botox, with bloodless  aspirate noted prior to each injection:  .  Marland Kitchen  Right piriformis: 100 units  .  Needle electromyography demonstrated marked motor  unit activity  at rest in each of the above muscles. The patient tolerated the  procedure well.  .  Effect of the Botox should be evident within 5-7 days with full  benefit by 2-3 weeks. She'll call when it is wearing off.  Marland Kitchen  PROCEDURE CODES  .  37357 EMG GUIDANCE FOR CHEMODENERVATION  .  89784 CHEMODENERV 1 EXTREMITY 1-4  .  R8412 BOTULINUM TOXIN TYPE A PER UNIT, Units: 100.00  .  FOLLOW UP  .  prn  .  Electronically signed by Carola Frost , MD on  03/07/2019 at 10:23 AM EDT  .  Document electronically signed by Carola Frost    .

## 2019-03-09 ENCOUNTER — Ambulatory Visit

## 2019-03-21 ENCOUNTER — Ambulatory Visit (HOSPITAL_BASED_OUTPATIENT_CLINIC_OR_DEPARTMENT_OTHER)

## 2019-03-21 NOTE — Progress Notes (Signed)
* * *        [name]**    ------    40 Y old Female, DOB: 08-23-1975    [address] [place], [place] [address]    Home: [phone]    Provider: [name]        * * *    Web Encounter    ---    Answered by    Staff, Clinical    Date: 03/21/2019        Time: 07:22 AM    Caller    [name]    ------            Reason    New Refill Request            Message                      Addressed To        ***Start of Medication List: ***       omeprazole      40 mg      1 cap(s)      once a day      ,      cyclobenzaprine      10 mg      1 tab(s)      HS, can increase up to 3 times a day      ,      ibuprofen      800 mg      1 tab(s)      3 times a day prn      ,      albuterol      90 mcg/inh      2 puff(s)      every 6 hours      ,      Celexa      40 mg      TAKE ONE TABLET BY MOUTH DAILY      once a day       ***End of Med List ***        Comment :                 Action Taken                      [name]  03/21/2019 7:31:21 AM > last CPE 04/21/2018, last FU appt 10/21/18, no pending appts.                Refills    Refill cyclobenzaprine tablet, 10 mg, orally, 90, 1 tab(s), HS,  can increase up to 3 times a day, 90 days    ------      Refill Celexa tablet, 20 mg, orally, 90 Tablet, 1 tab(s), once a day, 90  days, Refills=3      Refill omeprazole delayed release capsule, 40 mg, orally, 90, 1 cap(s),  once a day, 90 days, Refills=3      Refill ibuprofen tablet, 800 mg, orally, 100, 1 tab(s), 3 times a day prn      Refill albuterol aerosol, 90 mcg/inh, inhaled, 1, 2 puff(s), every 6 hours,  30 day(s)          * * *        **eMessages**   From:    Amri Lien    ------    Created:    2019-03-21 11:16:21.0    Sent:      Subject:  RE:New Refill Request    Message:    [name],        I refilled your RXes. Don't forget to schedule your physical for sometime in  October. Take care.        [name]                    ---          * * *          Patient: [name] DOB:  [birth year] Provider: [name] 03/21/2019    ---    Note generated by eClinicalWorks EMR/PM Software ([email or web address])

## 2019-03-29 ENCOUNTER — Ambulatory Visit

## 2019-04-01 ENCOUNTER — Ambulatory Visit: Admitting: Physical Medicine & Rehabilitation

## 2019-04-01 ENCOUNTER — Ambulatory Visit: Admit: 2019-04-01 | Payer: HMO

## 2019-04-01 NOTE — Progress Notes (Signed)
 Kohles, Melainie **DOB:** 07-01-1976 (42 yo F) **Acc No.** [id number] **DOS:**  04/01/2019    ---      **Progress Notes**    ---    **Patient:** [name], [name]     **Account Number:** [id number] **External MRN:** [id number]  **Provider:** Twana First Christionna Poland     **DOB:** 31-Dec-1975 **Age:** 43 Y **Sex:** Female  **Date:** 04/01/2019     **Phone:** [phone]  **CHN#:** [id number]     **Address:** [address], [place], [place]     **Pcp:** Dr. Katheran Awe, MD        * * *        **Subjective:**        ---      **Chief Complaints:**    ------      1\. HIP BURSITIS FLUOROSCOPICGUIDENCE -Right. 2. Screening tool negative.    ------     **HPI:**    _GENERAL_ :    The patient has ongoing hip pain has been exacerbated by movement and  alleviated by rest. This pain has been functionally limiting and rates at  least 6/10 VAS.    ------      **ROS:**    _Adult Physical Medicine and Rehabilitation_ :    Fever: No Fever. Appetite: Good. Sleep: Good. Weight: Stable. Cardiac:  negative ROS. Pulmonary: negative ROS. GU: Denies urinary incontinence . GI:  Denies incontinence of stool.. Weakness: Denies Generalized weakness . ENT:  negative ROS. Neuro: Denies dysarthria . Psych: negative ROS. Skin: Denies  rash.    Positive ROS discussed in HPI.    ------      **Medical History:** Atypical facial nerve pain.        ------     **Social History:**    Tobacco    history: _Never smoked_    Alcohol    _Denies_    ------      **Medications:** Taking Allegra 180 mg as needed, Taking Celexa 20 mg ,  Taking Omeprazole 20 MG Capsule Delayed Release 1 capsule 30 minutes before  morning meal Orally Once a day, Not-Taking/PRN Flexeril 10 mg 10 mg Tablets  one tablet orally every 8 hours prn pain, Not-Taking/PRN Motrin 800 mg as  needed, Medication List reviewed and reconciled with the patient    ------      **Allergies:** Codeine Sulfate.    ------        **Objective:**        ---      **Vitals:** Pain scale 5, Ht-in 62, Wt-lbs  185, BMI 33.83, BP 138/83, Ht-cm  157.48, Wt-kg 83.92, Pulse sitting 86    temp 99.    ------      **Physical Examination:**    _Adult Physical Medicine and Rehab_ :    General: Alert, cooperative, no acute distress. Affect appropriate.    Musculoskeletal: There is tenderness to palpation overlying the greater  trochanter.Marland Kitchen        ------        **Assessment:**        ---      **Assessment:**        1\. Greater trochanteric bursitis - M70.60 (Primary)    ------        **Plan:**        ---       **1\. Greater trochanteric bursitis**    Notes:    Greater trochanteric bursa steroid injection:    The region overlying the RIGHT greater trochanter was localized using anatomic  landmarks and fluoroscopic visualization. The  soft tissues overlying the  structure were anesthetized as necessary for the procedure using 1% lidocaine  without epinephrine. A 3.5 inch 25-gauge needle was then inserted and directed  toward the greater trochanter under fluoroscopic guidance. Upon contact with  the periosteum, the needle was retracted 2 mm. A 1 cc volume of Isovue  contrast was injected to confirm needle placement (see x-ray interpretation  below) into the bursa. Then, 1 cc of 40 mg/mL Depo-Medrol mixed with 2 cc of  0.25% bupivacaine without epinephrine and 2 cc of 1% lidocaine without  epinephrine was injected into the bursa.    Spot films: Multiplanar x-ray views were performed in AP and lateral planes of  the hip. After the contrast injection, contrast was seen in the bursa. There  was no evidence of extra bursa or vascular flow. A digital copy was retained  of hip spot films on the PACS system to document correct needle tip placement.    .    ---     **Procedure Codes:** 20610 Arthrocentesis, aspiration or injection of the  large bursa or joint, 81191 01: Fluro guide for needle placement, J1030 DEPO-  MEDROL 40 MG    ------      **Follow Up:** Follow-up will be in the adult physical medicine and  rehabilitation adult  clinic in 2 weeks. The patient may follow-up in the near  should he have any exacerbation of pain/any new pathology.    ------    ---    ---                ---    Electronically signed by Jerilee Field , MD on 04/01/2019 at 09:01 AM EDT    Sign off status: Completed          * * *      **Provider:** Debera Lat  **Date:** 04/01/2019    ------

## 2019-04-01 NOTE — Progress Notes (Signed)
 .  Progress Notes  .  Patient: [name], [name]  Provider: Debera Lat  DOB: 1975/08/07 Age: 43 Y Sex: Female  .  PCP: Katheran Awe MD  Date: 04/01/2019  .  --------------------------------------------------------------------------------  .  REASON FOR APPOINTMENT  .  1. HIP BURSITIS FLUOROSCOPICGUIDENCE -Right. 2. Screening tool  negative.  Marland Kitchen  HISTORY OF PRESENT ILLNESS  .  GENERAL:  The patient has ongoing hip pain has been  exacerbated by movement and alleviated by rest. This pain has  been functionally limiting and rates at least 6/10 VAS.  Marland Kitchen  CURRENT MEDICATIONS  .  Taking Allegra 180 mg as needed, Taking Celexa 20 mg , Taking  Omeprazole 20 MG Capsule Delayed Release 1 capsule 30 minutes  before morning meal Orally Once a day, Not-Taking/PRN Flexeril 10  mg 10 mg Tablets one tablet orally every 8 hours prn pain,  Not-Taking/PRN Motrin 800 mg as needed, Medication List reviewed  and reconciled with the patient  .  PAST MEDICAL HISTORY  .  Atypical facial nerve pain.  Marland Kitchen  ALLERGIES  .  Codeine Sulfate.  .  SOCIAL HISTORY  .  .  Tobacco  history:Never smoked  .  Marland Kitchen  Alcohol  Denies  .  REVIEW OF SYSTEMS  .  Adult Physical Medicine and Rehabilitation:  .  Fever:    No Fever . Appetite:    Good . Sleep:    Good . Weight:     Stable . Cardiac:    negative ROS . Pulmonary:    negative ROS  . GU:    Denies urinary incontinence  . GI:    Denies  incontinence of stool. . Weakness:    Denies Generalized weakness   . ENT:    negative ROS . Neuro:    Denies dysarthria  . Psych:     negative ROS . Skin:    Denies rash .  Marland Kitchen  Positive ROS discussed in HPI.  Marland Kitchen  VITAL SIGNS  .  Pain scale 5, Ht-in 62, Wt-lbs 185, BMI 33.83, BP 138/83, Ht-cm  157.48, Wt-kg 83.92, Pulse sitting 86temp 99.  Marland Kitchen  PHYSICAL EXAMINATION  .  Adult Physical Medicine and Rehab:  General:  Alert, cooperative, no acute distress. Affect  appropriate.  Musculoskeletal:  There is tenderness to palpation overlying the  greater  trochanter..  .  ASSESSMENTS  .  Greater trochanteric bursitis - M70.60 (Primary)  .  TREATMENT  .  Greater trochanteric bursitis  Notes:  Greater trochanteric bursa steroid injection:  The region overlying the RIGHT greater trochanter was localized  using anatomic landmarks and fluoroscopic visualization. The soft  tissues overlying the structure were anesthetized as necessary  for the procedure using 1% lidocaine without epinephrine. A 3.5  inch 25-gauge needle was then inserted and directed toward the  greater trochanter under fluoroscopic guidance. Upon contact with  the periosteum, the needle was retracted 2 mm. A 1 cc volume of  Isovue contrast was injected to confirm needle placement (see  x-ray interpretation below) into the bursa. Then, 1 cc of 40  mg/mL Depo-Medrol mixed with 2 cc of 0.25% bupivacaine without  epinephrine and 2 cc of 1% lidocaine without epinephrine was  injected into the bursa.  Spot films: Multiplanar x-ray views were performed in AP and  lateral planes of the hip. After the contrast injection, contrast  was seen in the bursa. There was no evidence of extra bursa or  vascular flow. A  digital copy was retained of hip spot films on  the PACS system to document correct needle tip placement.  .  .  PROCEDURE CODES  .  20610 Arthrocentesis, aspiration or injection of the large bursa  or joint, 38177 01: Fluro guide for needle placement, J1030  DEPO-MEDROL 40 MG  .  FOLLOW UP  .  Follow-up will be in the adult physical medicine and  rehabilitation adult clinic in 2 weeks. The patient may follow-up  in the near should he have any exacerbation of pain/any new  pathology.  .  Electronically signed by Jerilee Field , MD on  04/01/2019 at 09:01 AM EDT  .  Document electronically signed by Debera Lat

## 2019-04-15 ENCOUNTER — Ambulatory Visit: Admit: 2019-04-15 | Payer: HMO

## 2019-04-15 ENCOUNTER — Ambulatory Visit: Admitting: Physical Medicine & Rehabilitation

## 2019-04-15 NOTE — Progress Notes (Signed)
 .  Progress Notes  .  Patient: [name], [name]  Provider: Debera Lat  DOB: 1975/10/20 Age: 43 Y Sex: Female  .  PCP: Katheran Awe MD  Date: 04/15/2019  .  --------------------------------------------------------------------------------  .  REASON FOR APPOINTMENT  .  1. POST IJ  .  HISTORY OF PRESENT ILLNESS  .  [facility]:   Graziella is a pleasant 43 year old lady who presents for ongoing  evaluation of her right-sided hip pain. Since her last visit,  Daquana had a right-sided greater trochanteric bursa steroid  injection under fluoroscopic guidance. She feels at least 70%  relief of her pain following he injection and rates it up to 2/10  VAS at its worst. It is exacerbated by car rides. She ices the  hip and uses motrin as necessary. She no longer needs to take  flexeril for the pain.  Marland Kitchen  CURRENT MEDICATIONS  .  Taking Allegra 180 mg as needed  Taking Celexa 20 mg  Taking Omeprazole 20 MG Capsule Delayed Release 1 capsule 30  minutes before morning meal Orally Once a day  Not-Taking/PRN Flexeril 10 mg 10 mg Tablets one tablet orally  every 8 hours prn pain  Not-Taking/PRN Motrin 800 mg as needed  .  ALLERGIES  .  yes[Allergies Verified]  .  SOCIAL HISTORY  .  .  Tobacco  history:Never smoked  .  Marland Kitchen  Alcohol  Denies  .  REVIEW OF SYSTEMS  .  PM&R:  .  Systemic    No weight loss, insomnia, snoring, fatigue, or night  sweats . Psychiatric    No anxiety, stress, excess worry. No  depression . Neuro    No headache, convulsions, seizures,  fainting, dizziness, ADD or stroke . Spine    Positive back pain  and neck pain, no scoliosis . Neck    No swollen glands, thyroid  problems or carotid artery stenosis . Urinary    No frequency,  burning, urgency, foul odor or incontinence . Arms    No carpal  tunnel nor numbness and tingling in UE and no swelling . Legs     No numbness and tingling in LE and no swelling . Skin    no  significant rash ,or lesion .  Marland Kitchen  VITAL SIGNS  .  Pain scale 2, Ht-in  62, Wt-lbs 190, BMI 34.75, BSA 1.94, Ht-cm  157.48, Wt-kg 86.18, Wt Change 5 lb.  Marland Kitchen  EXAMINATION  .  PM&R Physical Examination:  General Appearance:Cooperative, No acute distress.  .  Mental Status:Normal orientation and comprehension.  .  General Examination:well-groomed.  Marland Kitchen  Speech:No dysarthria.  .  Auditory:Can hear me well.  .  Eyes:Can see me well.  .  Head:Normocephalic.  Marland Kitchen  Respiratory:Non-labored breathing, Symmetrical chest rise.  .  Cardiovascular:Warm extremities, No obvious cyanosis or clubbing.  .  Gastrointestinal:Non-distended abdomen.  .  Lymphatic:No lymphadenopathy in field of examination.  .  Skin:No rash or signs of infection.  .  Gait:Normal.  .  ASSESSMENTS  .  Greater trochanteric bursitis - M70.60 (Primary)  .  TREATMENT  .  Greater trochanteric bursitis  Start Physical Therapy, as directed, Notes: [facility] based  functional physical therapy, 2x week for six weeks, right hip  stretching/strengthening  Notes: Continue to significant relief of the patient's pain  following the right side of the greater trochanter bursa steroid  injection, I think it is worthwhile to let her continue more  conservative management. I  would like her to stretch out the  areas around her hip and see whether we can provide better relief  through physical therapy and hence, I will refer her to physical  therapy for this region.  .  She had the onset of this pain secondary to piriformis injections  of Botox. We will see whether the pain a recurrent recurs. If she  does have a recurrence, and may consider ordering MRI of the  right hip/thigh to rule out any muscle tendon tears.  .  , 25 minutes was spent with this patient in face-to-face  interaction with me and/or my staff in interview and coordination  of care.  .  FOLLOW UP  .  as needed  .  Electronically signed by Jerilee Field , MD on  04/15/2019 at 12:17 PM EDT  .  Document electronically signed by Debera Lat

## 2019-04-15 NOTE — Progress Notes (Signed)
 .  Progress Notes  .  Patient: [name], [name]  Provider: Debera Lat  DOB: 02/03/1976 Age: 43 Y Sex: Female  .  PCP: Katheran Awe MD  Date: 04/15/2019  .  --------------------------------------------------------------------------------  .  REASON FOR APPOINTMENT  .  1. POST IJ  .  HISTORY OF PRESENT ILLNESS  .  [facility]:   Glennie is a pleasant 43 year old lady who presents for ongoing  evaluation of her right-sided hip pain. Since her last visit,  Reveca had a right-sided greater trochanteric bursa steroid  injection under fluoroscopic guidance. She feels at least 70%  relief of her pain following he injection and rates it up to 2/10  VAS at its worst. It is exacerbated by car rides. She ices the  hip and uses motrin as necessary. She no longer needs to take  flexeril for the pain.  Marland Kitchen  CURRENT MEDICATIONS  .  Taking Allegra 180 mg as needed  Taking Celexa 20 mg  Taking Omeprazole 20 MG Capsule Delayed Release 1 capsule 30  minutes before morning meal Orally Once a day  Not-Taking/PRN Flexeril 10 mg 10 mg Tablets one tablet orally  every 8 hours prn pain  Not-Taking/PRN Motrin 800 mg as needed  .  ALLERGIES  .  yes[Allergies Verified]  .  SOCIAL HISTORY  .  .  Tobacco  history:Never smoked  .  Marland Kitchen  Alcohol  Denies  .  REVIEW OF SYSTEMS  .  PM&R:  .  Systemic    No weight loss, insomnia, snoring, fatigue, or night  sweats . Psychiatric    No anxiety, stress, excess worry. No  depression . Neuro    No headache, convulsions, seizures,  fainting, dizziness, ADD or stroke . Spine    Positive back pain  and neck pain, no scoliosis . Neck    No swollen glands, thyroid  problems or carotid artery stenosis . Urinary    No frequency,  burning, urgency, foul odor or incontinence . Arms    No carpal  tunnel nor numbness and tingling in UE and no swelling . Legs     No numbness and tingling in LE and no swelling . Skin    no  significant rash ,or lesion .  Marland Kitchen  VITAL SIGNS  .  Pain scale 2, Ht-in  62, Wt-lbs 190, BMI 34.75, BSA 1.94, Ht-cm  157.48, Wt-kg 86.18, Wt Change 5 lb.  Marland Kitchen  EXAMINATION  .  PM&R Physical Examination:  General Appearance:Cooperative, No acute distress.  .  Mental Status:Normal orientation and comprehension.  .  General Examination:well-groomed.  Marland Kitchen  Speech:No dysarthria.  .  Auditory:Can hear me well.  .  Eyes:Can see me well.  .  Head:Normocephalic.  Marland Kitchen  Respiratory:Non-labored breathing, Symmetrical chest rise.  .  Cardiovascular:Warm extremities, No obvious cyanosis or clubbing.  .  Gastrointestinal:Non-distended abdomen.  .  Lymphatic:No lymphadenopathy in field of examination.  .  Skin:No rash or signs of infection.  .  Gait:Normal.  .  ASSESSMENTS  .  Greater trochanteric bursitis - M70.60 (Primary)  .  TREATMENT  .  Greater trochanteric bursitis  Start Physical Therapy, as directed, Notes: [facility] based  functional physical therapy, 2x week for six weeks, right hip  stretching/strengthening  Physical Therapy2/week. 6 weeks. HIP ROM and stretching  especially ITB. Hip strengthening.  Notes: Continue to significant relief of the patient's pain  following the right side of the greater trochanter bursa steroid  injection,  I think it is worthwhile to let her continue more  conservative management. I would like her to stretch out the  areas around her hip and see whether we can provide better relief  through physical therapy and hence, I will refer her to physical  therapy for this region.  .  She had the onset of this pain secondary to piriformis injections  of Botox. We will see whether the pain a recurrent recurs. If she  does have a recurrence, and may consider ordering MRI of the  right hip/thigh to rule out any muscle tendon tears.  .  , 25 minutes was spent with this patient in face-to-face  interaction with me and/or my staff in interview and coordination  of care.  .  FOLLOW UP  .  as needed  .  Electronically signed by Jerilee Field , MD on  04/15/2019 at 12:35 PM EDT  .  Document  electronically signed by Debera Lat

## 2019-04-15 NOTE — Progress Notes (Signed)
 * * *    [name] [name] **DOB:** [birth year] (43 yo F) **Acc No.** [id number] **DOS:**  04/15/2019    ---       [name] [name], [name] [name]**    ------    43 Y old Female, DOB: 07-28-1975, External MRN: [id number]    Account Number: [id number]    [address], [place], [place]    Home: [phone]    Insurance: HMO [employer] Payer ID: PAPER    PCP: Dr. Katheran Awe, MD Referring: Dr. Katheran Awe, MD  External Visit ID: [id number]    Appointment Facility: [facility]        * * *    04/15/2019 Progress Notes: [name] LatCHN#:** [id number]    ------    ---       **Current Medications**    ---    Taking    * Allegra 180 mg as needed    ---    * Celexa 20 mg     ---    * Omeprazole 20 MG Capsule Delayed Release 1 capsule 30 minutes before morning meal Orally Once a day    ---    Not-Taking/PRN    * Flexeril 10 mg 10 mg Tablets one tablet orally every 8 hours prn pain    ---    * Motrin 800 mg as needed    ---      **Social History**    ---    Tobacco    history: _Never smoked_    Alcohol    _Denies_      **Review of Systems**    ---     _PM &R_:    Systemic No weight loss, insomnia, snoring, fatigue, or night sweats.  Psychiatric No anxiety, stress, excess worry. No depression. Neuro No  headache, convulsions, seizures, fainting, dizziness, ADD or stroke. Spine  Positive back pain and neck pain, no scoliosis. Neck No swollen glands,  thyroid problems or carotid artery stenosis. Urinary No frequency, burning,  urgency, foul odor or incontinence. Arms No carpal tunnel nor numbness and  tingling in UE and no swelling. Legs No numbness and tingling in LE and no  swelling. Skin no significant rash ,or lesion.          **Reason for Appointment**    ---      1\. POST IJ    ---      **History of Present Illness**    ---     _[facility]    [name] [name] is a pleasant 43 year old lady who presents for ongoing evaluation of her  right-sided hip pain. Since her last visit,  [name] had a right-sided greater  trochanteric bursa steroid injection under fluoroscopic guidance. She feels at  least 70% relief of her pain following he injection and rates it up to 2/10  VAS at its worst. It is exacerbated by car rides. She ices the hip and uses  motrin as necessary. She no longer needs to take flexeril for the pain.      **Vital Signs**    ---    Pain scale 2, Ht-in 62, Wt-lbs 190, BMI 34.75, BSA 1.94, Ht-cm 157.48, Wt-kg  86.18, Wt Change 5 lb.      **Examination**    ---     _PM &R Physical Examination:_    General Appearance: Cooperative, No acute distress.    Mental Status: Normal orientation and comprehension.    General Examination: well-groomed.    Speech:  No dysarthria.    Auditory: Can hear me well.    Eyes: Can see me well.    Head: Normocephalic.    Respiratory: Non-labored breathing, Symmetrical chest rise.    Cardiovascular: Warm extremities, No obvious cyanosis or clubbing.    Gastrointestinal: Non-distended abdomen.    Lymphatic: No lymphadenopathy in field of examination.    Skin: No rash or signs of infection.    Gait: Normal.         **Assessments**    ---    1\. Greater trochanteric bursitis - M70.60 (Primary)    ---      **Treatment**    ---      **1\. Greater trochanteric bursitis**    Start Physical Therapy, as directed, Notes: [facility] based functional physical  therapy, 2x week for six weeks, right hip stretching/strengthening    _PROCEDURE: Physical Therapy_   2/week. 6 weeks. HIP ROM and stretching  especially ITB. Hip strengthening.    ------        Notes: Continue to significant relief of the patient's pain following the  right side of the greater trochanter bursa steroid injection, I think it is  worthwhile to let her continue more conservative management. I would like her  to stretch out the areas around her hip and see whether we can provide better  relief through physical therapy and hence, I will refer her to physical  therapy for this region.        She had  the onset of this pain secondary to piriformis injections of Botox. We  will see whether the pain a recurrent recurs. If she does have a recurrence,  and may consider ordering MRI of the right hip/thigh to rule out any muscle  tendon tears.        , 25 minutes was spent with this patient in face-to-face interaction with me  and/or my staff in interview and coordination of care.     **Follow Up**    ---    as needed    Electronically signed by Jerilee Field , MD on 04/15/2019 at 12:35 PM EDT    Sign off status: Completed        * * *        [facility]    [address]    [place] [address]    [place], [place] [address]    Tel: [phone]    Fax: [phone]              * * *          Progress Note: [name] [name] 04/15/2019    ---    Note generated by eClinicalWorks EMR/PM Software ([email or web address])

## 2019-05-03 ENCOUNTER — Ambulatory Visit

## 2019-05-04 ENCOUNTER — Ambulatory Visit (HOSPITAL_BASED_OUTPATIENT_CLINIC_OR_DEPARTMENT_OTHER)

## 2019-05-04 NOTE — Progress Notes (Signed)
* * *    [name], [name] **DOB:** Jul 11, 1975 [phone] F) **Acc No.** [id number] **DOS:**  05/04/2019    ---        [name], [name]**    ------    43 Y old Female, DOB: 1975/09/26    Account Number: [id number]    [address], [place], [id number]    Home: [phone]    Guarantor: [name] Insurance: BC[employer]: HMO BLUE [place] - OPTIONS    PCP: Karlyne Greenspan, JR    Appointment Facility: [facility]        * * *    05/04/2019    Progress Notes: [name], NP    ------    ---        **Reason for Appointment**    ---      1\. CPE. Pt aware of Covid 19 policy. gave back line to call    ---      **History of Present Illness**    ---    _Interim History_ :    Here for CPE. She is now on 40 mg omeprazole but she feels nauseous everyday.  Doesn't feel like it's acid, but feels constant discomfort. "I feel lousy." "I  know my diet is bad and I'm not taking care of myself." Has never had an EGD.  Has had 2 UGIs. Not affected by diet. Not after meals, occurs all day long.  Worse when she lays down. No acid brash. Has a bit of throat clearing cough.    Unhappy with her weight.    Has L tennis elbow.    Getting Botox to her piriformis. It has made a huge difference. She also had  trochanter bursitis and just got a cortisone shot which helped a lot.    Has L lateral epidondylitis, self diagnosed. Uses a touch pad and texts a lot.  Has a tennis elbow brace.    She went up to 40 mg Celexa when her mom died but now back on 20 mg as last RX  sent was for 20 mg. Cymbalta caused headaches so she stopped it. Anxiety has  improved now that her mom has passed and she worries less about her. Some  difficulty w/son and remote learning. Is mentally and emotionally exhausted.  Didn't find therapy helpful, "I wasn't honest with her." Aware it might help  to see someone else. Has no energy. Sleeps well.    _Female CPE/Screening_ :    Recent consults -  03/07/19 Dr. Jonathon Bellows piriformic, trochanter bursitis,  botox  injections  . PAP/HPV -  Date done - 04/2018 neg/neg  . Mammogram -  Date done  - 06/23/18  ,  Normal  ,  Up to date  . Immunizations  Tdap 10/18,had her flu  shot already  . Contraception  Mirena IUD, replaced 2016, "fine"  .    _PHQ Form_ :    PHQ9 Form Little interest or pleasure in doing things **Not at all** , Feeling  down, depressed, hopeless **Several days** , Trouble falling or staying  asleep, or sleeping to much **Not at all** , Feeling tired or little energy  **Several days** , Poor appetite or overeating **Several days** , Feeling bad  about yourself-or that you are a failure or let yourself or your family down  **Not at all** , Trouble concentrating on things such as reading the newspaper  or watching television **Not at all** , Moving or speaking so slowly that  other people could  have noticed. or the opposite - being so fidgety or  restless that you have been moving around a lot more than usual **Not at all**  , Thoughts that you were better off dead, or of hurting yourself in someway?  **Not at all** , Total score **3** , Interpretation **Minimal Depression** .      **Current Medications**    ---    Taking      * ProAir HFA CFC free 90 mcg/inh aerosol with adapter 2 puff(s) inhaled 20-30 min before exercise     ---    * Astelin 137 mcg/inh spray 2 spray(s) intranasally 2 times a day     ---    * Fioricet 325 mg-50 mg-40 mg tablet 2 tab(s) orally Q4H maximum 6 per day prn     ---    * Ativan 0.5 mg tablet 1 tab(s) orally HS, prn     ---    * cyclobenzaprine 10 mg tablet 1 tab(s) orally HS, can increase up to 3 times a day     ---    * Celexa 20 mg tablet 1 tab(s) orally once a day     ---    * omeprazole 40 mg delayed release capsule 1 cap(s) orally once a day     ---    * ibuprofen 800 mg tablet 1 tab(s) orally 3 times a day prn     ---    * albuterol 90 mcg/inh aerosol 2 puff(s) inhaled every 6 hours     ---    * Allegra     ---    Not-Taking    * Topamax 50 mg tablet 1 tab(s) orally 2  times a day     ---    * Celexa 40 mg tablet TAKE ONE TABLET BY MOUTH DAILY orally once a day     ---    * PREDNISONE 10 MG TABLET 6 TABS QD X 2 DAYS THEN DECREASE BY 1 TAB EVERY 2 DAYS PO     ---    Discontinued    * Zyrtec 10 mg tablet 1 tab(s) orally once a day     ---    * Medication List reviewed and reconciled with the patient     ---      **Past Medical History**    ---      anxiety - stable w/Celexa.        ---    mirena IUD 7/06, 7/11.        ---    anemia w/ preg, normal hgb at [facility] visit.        ---    GERD - uses OTC Prilosec prn.        ---    asthma, mild - occ use of ProAir.        ---    migraines - respond to Fioricet, not often.        ---    BMD 2009 - normal.        ---    PAP SMEAR (ASC-US) with neg HPV 2008, all repeat PAPs WNL.        ---      **Surgical History**    ---      Tonsillectomy 2002    ---    Lasik 2005    ---      **Family History**    ---      Father: alive 10 yrs, anxiety, basal cell removed, depression,  cardiomyopathy,  diagnosed with Heart Disease, Cancer    ---    Mother: deceased 63 yrs, hypertension, Diabetes, hypothyroid, renal cell CA,  orthostatic tremor, MI. stents, CHF, died of cardiac arrest, Hypertension,  Heart Disease, Diabetes    ---    Sibling 1: alive 5 yrs, biological brother, drug addiction; had 2 sisters and  4 brothers - one sister HIV who is dying; lost a sister to a brain aneurysm  (half sister), bio brother is an addict, 1/2 brother dying of ETOH and drug  induced liver disease, one 1/2 brothers one w/ulcerative colitis, one 1/2  brother has anxiety but is healthy, Mental Illness    ---    Maternal Grand Mother: deceased 68 yrs, died of an MI    ---    Aunt(s): deceased 41 yrs, cardiogenic shock and respiratory arrest from an MI,  smoker    ---    has 1 bio brother, 1 half sis died, has another sister and 3 half brothers;  cousin deceased from [place] age 46. No breast, ovarian or colon cancers. Has 1  full brother and 5 half siblings; all healthy;  half brother 58 yrs died liver  disease.    ---      **Social History**    ---    no Tobacco Use  Status: **never smoker** , Patient counseled on the dangers of  tobacco use: **08/16/2018** .    Seat belt: yes.    Alcohol: socially.    no Drug use, CBD oil on her back, helps.    Diet/Nutrition: "gained the freshman 40," and more thereafter, daily yogurt,  no supplements, daily fruits and vegetables, whole grains, problem is portion  sizes and snacking/picking.    no Exercise.    Marital Status: married, "awesome".    no Domestic violence.    Children: 2, son, [name], 63 yrs and daughter, [name] 15 yrs.    Number of household members: 5, self, spouse, 2 children,and a 4yo foster  child.    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    no Travel outside [place].    Religion: Catholic.    Caffeine: yes, 1 cups coffee per day.    Home smoke detector use: yes.    Pets: yes, 1 dog.    Sexual orientation: heterosexual.    Sexually active: yes.      **Gyn History**    ---    Periods : none on Mirena.    Sexual activity currently sexually active, monogamous.    Last pap smear date 08/02/14 Neg, 01/15/2010 Neg, absent TE zone.    Abnormal pap smear none.    Date of Last Period Mirena IUD, no periods.    STD none.    Birth control Mirena IUD.      **OB History**    ---    Total pregnancies 2\.    Total living children 2003 - Julia, 2006 - Joshua.    Pregnancy # 1: NSVD, no complications.    Pregnancy # 2: NSVD, no complications.      **Allergies**    ---      codeine: vomiting    ---      **Hospitalization/Major Diagnostic Procedure**    ---      Denies Past Hospitalization    ---      **Review of Systems**    ---    _CONSTITUTIONAL_ :    Loss of Appetite  none  . Unexplained weight change  none  .  _OPTHALMOLOGY_ :    Change in Vision  none  . Glasses or contacts  none, had lasik surgery  ,  sees eye doctor regularly  .    _ENT_ :    Hoarseness  none  . Swollen Lymph Nodes  none  . Difficulty  swallowing  no  .  Dental care  up-to-date  .    _ALLERGY_ :    Environmental Allergies  many allergies, takes nasal steroid and antihistamine  .    _RESPIRATORY_ :    Shortness of Breath  none  . Persistent Cough  none  .    _CARDIOLOGY_ :    Chest Pain  none  . Palpitations  none  . Leg Edema  none  . Exertional  symptoms  none  .    _GASTROENTEROLOGY_ :    Dysphagia  none  . Abdominal Pain  none  . Constipation  none  . Diarrhea  none  . Blood in Stool  none  . Heartburn  none  .    _FEMALE REPRODUCTIVE_ :    Hot Flashes  none  . Abnormal Vaginal Discharge  none  . Dyspareunia  none  .  Irregular Menses  not getting periods but gets cramps and moodiness, bloating  . Breast lump  none  . Practices SBE  regulary  . Vaginal dryness  none  .    _UROLOGY_ :    Blood in Urine  none  . Urinary Incontinence  none  . Nocturia  None  .  Dysuria  none  .    _MUSCULOSKELETAL_ :    Joint Swelling  none  . Joint Pain  piriformis, trochanter bursitis, both R  side, significantly better  . Joint Stiffness  none  .    _NEUROLOGY_ :    Tingling/Numbness  none  . Dizziness  none  . Weakness  none  . Visual Changes  none  . Headaches  none  .    _PSYCHOLOGY_ :    Depression  as above  . Sleep Disturbances  none  . Anxiety  as above  .    _DERMATOLOGY_ :    New or changing skin lesions  none  .    _HEMATOLOGY/LYMPH_ :    Bleeding tendencies  none  .          **Vital Signs**    ---    HR **79** , BP **120/70** , O2 Sat. **99** , Ht 62, Wt **199** , Wt Change 10  lb, BMI  **36.39** .      **Examination**    ---    _General Examination:_    General  well nourished, well-hydrated female in  NAD  ,  appropriate attire  and affect  .    HEENT:  TMs and ear canals clear, conjunctiva clear, nares patent, oropharynx  clear with MMM  .    Neck, Thyroid :  supple  ,  no thyromegaly  ,  no lymphadenopathy  .    Breasts :  no lumps felt on either side  ,  no dimpling  ,  no skin changes  ,  no axillary lymphadenopathy  .    Heart:  RRR  ,  no  murmurs  .    Lungs:  clear to auscultation bilaterally  .    Abdomen:  soft, NT/ND, BS present  ,  no hepatosplenomegaly  ,  no masses  palpated  .    Extremities  normal ROM  ,  no edema; tender L lateral epicondyle  .    Peripheral pulses:  normal (2+) bilaterally  .    Skin:  no rash or suspicious skin lesions  .    Neurologic Exam:  non-focal exam  .          **Assessments**    ---    1\. Routine medical exam - Z00.00 (Primary)    ---    2\. Depression with anxiety - F41.8    ---    3\. 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 Acne - L70.9    ---    5\. Migraine headache - G43.909    ---    6\. 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 Allergic rhinitis - J30.9    ---    8\. GERD (gastroesophageal reflux disease) - K21.9    ---    9\. Chronic low back pain - M54.5    ---    10\. Obesity (BMI 30.0-34.9) - E66.9    ---    11\. BMI 33.0-33.9,adult - Z68.33    ---    12\. Nausea - R11.0    ---      **Treatment**    ---      **1\. Routine medical exam**    _LAB: C-Reactive Protein High Sensitivity_    _LAB: CBC w/ Indices_    _LAB: Comprehensive Metabolic Panel_    _LAB: Hemoglobin A1c_    _LAB: Lipid Panel_    _LAB: TSH Reflex Panel (Recommended)_    Notes: Immunizations UTD. Flu shot was already rec'd. PAP and mammogram UTD.  Rev'd PHQ9. Discussed diet and exercise. She is worried about her FH heart  disease. Will check a HS C-RP and do a Reynold's score on her. I also told her  about cardiac calcium testing which she can arrange at the hospital. Will  check screening labs.    ---        **2\. Depression with anxiety**    Decrease Celexa tablet, 20 mg, TAKE ONE TABLET BY MOUTH DAILY    Continue Ativan tablet, 0.5 mg, 1 tab(s), orally, HS, prn, 30 days, Refills 0    Start Venlafaxine Hydrochloride ER capsule, extended release, 37.5 mg, 1  cap(s), orally, once a day, 30 day(s), 30, Refills 1    Notes:    She is tired, no energy. Will try Effexor and she will wean off Celexa at the  same  time as she is very sensitive to stopping SSRIs. Anxiety much better,  rarely takes Ativan    .        **3\. Exercise-induced asthma**    Refill ProAir HFA aerosol with adapter, CFC free 90 mcg/inh, 2 puff(s),  inhaled, 20-30 min before exercise, 1    Notes:    Rarely needs inhaler but plans to start back at the gym.    .        **4\. Acne**    Notes:    Stable. Treats w/OTCs    .        **5\. Migraine headache**    Continue Fioricet tablet, 325 mg-50 mg-40 mg, 2 tab(s), orally, Q4H maximum 6  per day prn    Notes:    She is getting both her migraines and tension headaches. She used to see neuro  for migraines and was on Elavil for a short time. She really doesn't want to  restart. She finds that taking  5 mg Flexeril BID helps the tension headaches.  Will have her continue OTCs, Fioricet, etc. Never took Topamax because her  headaches got better    .        **6\. Attention deficit disorder of adult**    Notes:    Not currently on meds. Managing on her own    .        **7\. Allergic rhinitis**    Continue Astelin spray, 137 mcg/inh, 2 spray(s), intranasally, 2 times a day    Notes:    Seasonal. Taking Allegra and Flonase    .        **8\. GERD (gastroesophageal reflux disease)**    Increase omeprazole delayed release capsule, 20 mg, 2 cap(s), orally, once a  day, 90 days, 180 Capsule, Refills 3, Notes: Getting some nausea w/ 40 mg  dose, would like to take 2x20 mg dose    _LAB: Helicobacter Pylori Antigen_    Notes:    She is nauseous most days. Her nausea started after she got a cortisone  injection and right after that she got a period for the first time. Will refer  to GI and check an H Pylori and HCG (although she has a [place] and husband had  a vasectomy). Has gained weight which may be aggravating her GERD. She thinks  the 40 mg capsule of omeprazole may be making her nauseous; requested 2x 20 mg  capsules    .    Referral To:[facility]    Reason:Daliy nausea            **9\. Chronic low  back pain**    Refill ibuprofen tablet, 800 mg, 1 tab(s), orally, 3 times a day, 90 days, 270  Tablet    Continue cyclobenzaprine tablet, 10 mg, 1 tab(s), orally, HS, can increase up  to 3 times a day    Notes:    Sees Dr. Jonathon Bellows, physiatrist. Responded well to Botox into the piriformis  and  cortisone into the R trochanter    .        **10\. Obesity (BMI 30.0-34.9)**    Notes:    She is very frustrating about her weight. Plans to start going to the gym and  improving diet    .        **11\. BMI 33.0-33.9,adult**    Notes:    As above    .        **12\. Nausea**    _LAB: hCG Quantitative_    Notes: As above.        **13\. Others**    Notes: Patient Educated with: FLU Inactive.pdf (FLU Inactive.pdf)  Immunizations up-to-date.  .      **Procedure Codes**    ---      [id number] BRIEF EMOTIONAL/BEHAV ASSMT    ---      **Follow Up**    ---    4 Weeks (Reason: Adj disorder)    Electronically signed by [name] , RNCFNP on 05/17/2019 at 06:58 PM EST    Sign off status: Completed        * * [facility]    [address] [place], [place] [address]    Tel: [phone]    Fax: [phone]              * * *          Progress Note: [name] Braun  Regana Kemple, NP 05/04/2019    ---    Note generated by eClinicalWorks EMR/PM Software ([email or web address])

## 2019-05-10 ENCOUNTER — Ambulatory Visit: Admitting: Physical Medicine & Rehabilitation

## 2019-05-17 ENCOUNTER — Ambulatory Visit (HOSPITAL_BASED_OUTPATIENT_CLINIC_OR_DEPARTMENT_OTHER)

## 2019-05-17 NOTE — Progress Notes (Signed)
* * *      [name], [name] **DOB:** 1975-11-23 (43 yo F) **Acc No.** [id number] **DOS:**  05/17/2019    ---        [name], [name]**    ------    88 Y old Female, DOB: 05-Feb-1976    [address] [place], [place] [address]    Home: [phone]    Provider: [name]        * * *    Web Encounter    ---    Answered by    [name]    Date: 05/17/2019        Time: 06:59 PM    Caller    KP    ------            Reason    Lab reminder                * * *        **eMessages**   FromDenna Haggard    ------    Created:    2019-05-17 18:59:31.0    Sent:      Subject:    RE:Lab reminder    Message:    [name],            I wanted to remind you to do your lab tests. [facility]. You can go to any of the ones listed in the link  below. Let me know if you have any questions.            Regards,    [name]            [email or web address]                    ---          * * *          Provider: [name] 05/17/2019    ---    Note generated by eClinicalWorks EMR/PM Software ([email or web address])

## 2019-05-27 ENCOUNTER — Ambulatory Visit (HOSPITAL_BASED_OUTPATIENT_CLINIC_OR_DEPARTMENT_OTHER): Admitting: Family Medicine

## 2019-05-27 NOTE — Progress Notes (Signed)
* * *      [name], [name] **DOB:** [birth year] [phone] yo F) **Acc No.** [id number] **DOS:**  05/27/2019    ---        [name], [name]**    ------    86 Y old Female, DOB: 1975/08/21    [address] [place], [place] [address]    Home: [phone]    Provider: Manus Rudd        * * *    Telephone Encounter    ---    Answered by    Drue Stager    Date: 05/27/2019        Time: 10:44 AM    Jenelle Mages    ------            Reason    COVID            Message                      Pt dtr came back positive for COVID-19. Pt is an [employer] employee. Occ health testing her tomorrow.                    * * *                ---          * * *          Provider: Manus Rudd 05/27/2019    ---    Note generated by eClinicalWorks EMR/PM Software ([email or web address])

## 2019-05-28 ENCOUNTER — Ambulatory Visit: Admitting: General Acute Care Hospital

## 2019-05-28 LAB — HX COVID19 BY PCR (LGH)
CASE NUMBER: 2020326001257
HX COVID19 BY PCR: NOT DETECTED

## 2019-05-31 ENCOUNTER — Ambulatory Visit

## 2019-06-05 ENCOUNTER — Ambulatory Visit (HOSPITAL_BASED_OUTPATIENT_CLINIC_OR_DEPARTMENT_OTHER)

## 2019-06-05 NOTE — Progress Notes (Signed)
* * *      [name], [name] **DOB:** 1976/02/27 (43 yo F) **Acc No.** [id number] **DOS:**  06/05/2019    ---        [name], [name]**    ------    56 Y old Female, DOB: 29-Aug-1975    [address] [place], [place] [address]    Home: [phone]    Provider: [name]        * * *    Web Encounter    ---    Answered by    Staff, Clinical    Date: 06/05/2019        Time: 01:17 PM    Caller    Jenai Amadi    ------            Reason    Med increase            Message                      Addressed To [name],       Would it be ok to increase the Effexor to 75mg  daily? I feel edgy on 37.5 like everything is bothering me.             I was on quarantine because my daughter was Covid positive so I will get those labs done in the next week or so.             [name]                    * * *        **eMessages**   From:    [name]    ------    Created:    2019-06-05 17:10:57.0    Sent:      Subject:    RE:Med increase    Message:    [name],        Yes, try the higher dose. I started you pretty low. It is an activating  medication so see how you do. Sorry about your daughter. Hope you are all OK.        [name]                    ---          * * *          Provider: [name] 06/05/2019    ---    Note generated by eClinicalWorks EMR/PM Software ([email or web address])

## 2019-06-08 ENCOUNTER — Ambulatory Visit

## 2019-06-08 ENCOUNTER — Ambulatory Visit (HOSPITAL_BASED_OUTPATIENT_CLINIC_OR_DEPARTMENT_OTHER)

## 2019-06-08 LAB — HX LIPID PANEL
CASE NUMBER: 2020337002158
HX CHOL: 280 mg/dL — ABNORMAL HIGH
HX HDL: 43 mg/dL — NL
HX LDL: 180 mg/dL — ABNORMAL HIGH
HX TRIG: 284 mg/dL — ABNORMAL HIGH

## 2019-06-08 LAB — HX CBC W/ INDICES
CASE NUMBER: 2020337002158
HX ABSOLUTE NRBC COUNT: 0 10*3/uL
HX HCT: 40.7 % — NL (ref 36.0–47.0)
HX HGB: 14.2 g/dL — NL (ref 11.8–16.0)
HX MCH: 30.6 pg — NL (ref 26.0–34.0)
HX MCHC: 34.9 g/dL — NL (ref 31.0–37.0)
HX MCV: 87.7 fL — NL (ref 80.0–100.0)
HX MPV: 10 fL — NL (ref 9.4–12.4)
HX NRBC PERCENT: 0 % — NL
HX PLATELET: 238 10*3/uL — NL (ref 150.0–400.0)
HX RBC: 4.64 10*6/uL — NL (ref 3.9–5.2)
HX RDW-CV: 12.1 % — NL (ref 11.5–14.5)
HX RDW-SD: 39 fL — NL (ref 35.0–51.0)
HX WBC: 7.6 10*3/uL — NL (ref 3.7–11.2)

## 2019-06-08 LAB — HX COMPREHENSIVE METABOLIC PANEL
CASE NUMBER: 2020337002158
HX ALBUMIN LVL: 4.4 g/dL — NL (ref 3.2–5.0)
HX ALKALINE PHOSPHATASE: 71 U/L — NL (ref 30.0–117.0)
HX ALT: 38 U/L — NL (ref 6.0–55.0)
HX ANION GAP: 9 — NL (ref 3.0–11.0)
HX AST: 22 U/L — NL (ref 6.0–40.0)
HX BILIRUBIN TOTAL: 0.4 mg/dL — NL (ref 0.2–1.2)
HX BUN: 15 mg/dL — NL (ref 6.0–20.0)
HX CALCIUM LVL: 10 mg/dL — NL (ref 8.5–10.5)
HX CHLORIDE: 105 mmol/L — NL (ref 98.0–110.0)
HX CO2: 27 mmol/L — NL (ref 21.0–32.0)
HX CREATININE: 0.873 mg/dL — NL (ref 0.55–1.3)
HX GLUCOSE LVL: 79 mg/dL — NL (ref 70.0–110.0)
HX POTASSIUM LVL: 4.5 mmol/L — NL (ref 3.6–5.2)
HX SODIUM LVL: 141 mmol/L — NL (ref 136.0–146.0)
HX TOTAL PROTEIN: 7.5 g/dL — NL (ref 6.0–8.4)

## 2019-06-08 LAB — HX GLOMERULAR FILTRATION RATE (ESTIMATED)
CASE NUMBER: 2020337002158
HX AFN AMER GLOMERULAR FILTRATION RATE: >90
HX NON-AFN AMER GLOMERULAR FILTRATION RATE: 83 mL/min/{1.73_m2}

## 2019-06-08 LAB — HX HEMOGLOBIN A1C
CASE NUMBER: 2020337002158
HX EST AVERAGE GLUCOSE (EAG): 105 mg/dL
HX HBF (INTERNAL): 1 % — NL
HX HEMOGLOBIN A1C: 5.3 % — NL
HX LA1C (INTERNAL): 1.7 % — NL
HX P3 PEAK (INTERNAL): 4.9 % — NL
HX TOTAL AREA RANGE (INTERNAL): 153688 microvolt/sec — NL (ref 50000.0–350000.0)

## 2019-06-08 LAB — HX TSH REFLEX PANEL (RECOMMENDED)
CASE NUMBER: 2020337002158
HX 3RD GEN TSH: 3.22 u[IU]/mL — NL (ref 0.358–3.74)

## 2019-06-08 LAB — HX HCG QUANTITATIVE
CASE NUMBER: 2020337002158
HX HCG QUANT: <1 — NL (ref 0.0–4.0)

## 2019-06-08 LAB — HX C-REACTIVE PROTEIN HIGH SENSITIVITY
CASE NUMBER: 2020337002158
HX CRP HIGH SENSITIVITY: 3.44 mg/L

## 2019-06-09 ENCOUNTER — Ambulatory Visit (HOSPITAL_BASED_OUTPATIENT_CLINIC_OR_DEPARTMENT_OTHER)

## 2019-06-09 LAB — HX HELICOBACTER PYLORI ANTIGEN
CASE NUMBER: 2020337002342
HX H PYLORI AG: NOT DETECTED — NL
HX INTERNAL NEG HP: NEGATIVE — NL
HX INTERNAL POS HP: POSITIVE — NL

## 2019-06-09 NOTE — Progress Notes (Signed)
* * *      Freimuth, Juliany **DOB:** [birth year] [phone] yo F) **Acc No.** [id number] **DOS:**  06/09/2019    ---        [name], [name]**    ------    24 Y old Female, DOB: 1975-12-13    [address], [place], [place] [address]    Home: [phone]    Provider: [name]        * * *    Web Encounter    ---    Answered by    [name]    Date: 06/09/2019        Time: 11:28 AM    Caller    KP    ------            Reason    Results                * * *        **eMessages**   From:    [name]    ------    Created:    2019-06-09 11:31:09.0    Sent:      Subject:    HU:DJSHFWY    Message:    [name],        All your labs are back. You are negative for H Pylori. See what GI recommends.  Your cholesterol is high but I used 2 different risk calculators (ASCVD and  Reynold's score) and they both put you at low 10-year risk for heart disease.  As you get older, that risk will increase. Now is a good time to work on diet  and exercise. Good luck and take care. Happy holidays to you and your family.        [name]                    ---          * * *          Provider: [name] 06/09/2019    ---    Note generated by eClinicalWorks EMR/PM Software ([email or web address])

## 2019-06-13 ENCOUNTER — Ambulatory Visit (HOSPITAL_BASED_OUTPATIENT_CLINIC_OR_DEPARTMENT_OTHER)

## 2019-06-13 NOTE — Progress Notes (Signed)
* * *    [name], [name] **DOB:** Feb 24, 1976 [phone] yo F) **Acc No.** [id number] **DOS:**  06/13/2019    ---        [name], [name]**    ------    43 Y old Female, DOB: 07-31-1975    Account Number: [id number]    [address] [place], [place]    Home: [phone]    Guarantor: [name] Insurance: [employer]: HMO BLUE [place] - OPTIONS    PCP: Karlyne Greenspan, JR    Appointment Facility: [facility]        * * *    06/13/2019    Progress Notes: [name], NP    ------    ---        **Reason for Appointment**    ---      1\. 4 week f/u    ---    2\. *Televisit*    ---    3\. Video Televisit, pt at home (during Covid pandemic), provider in the  office    ---      **History of Present Illness**    ---    _Health Screening_ :    Televisit to discuss response to Venlafaxine. Celexa was tapered down. She  increased the dose of Venlafaxine after sending me a message.    Also, labs in. All OK.    She is happy she was able to come off the Celexa which completely reduced her  libido; feels that it's coming back. No problems w/Venlafaxine. Doesn't feel  much improved, but not worse. Still waking up w/some anxiety, middle of the  night with "stupid, random worries." She feels she needs to give it more time.    Going for her EGD end of January.    _TeleVisit Consent_ :    Consent: I introduced and identified myself, received verbal consent from the  patient to proceed with this video and/or telephone visit and made the patient  aware that the same confidentiality and information security practices apply.  The patient also understands there may be a subsequent co-pay or payment asked  of him/her. **Yes** . Patient Verification: I verified the patient's name and,  date of birth, and as well as payer information ID if available. **Yes** .  Patient Location: I verified the patient's location: **Home** . Physician  Location: I verified my location: **Clinic** . Visit Conducted By: This visit  was conducted  by: **Video** .      **Current Medications**    ---    Taking      * Fioricet 325 mg-50 mg-40 mg tablet 2 tab(s) orally Q4H maximum 6 per day prn     ---    * Ativan 0.5 mg tablet 1 tab(s) orally HS, prn     ---    * Venlafaxine Hydrochloride ER 37.5 mg capsule, extended release 2 cap(s) orally once a day, Notes: taking 75 mg     ---    * ProAir HFA CFC free 90 mcg/inh aerosol with adapter 2 puff(s) inhaled 20-30 min before exercise     ---    * Astelin 137 mcg/inh spray 2 spray(s) intranasally 2 times a day     ---    * albuterol 90 mcg/inh aerosol 2 puff(s) inhaled every 6 hours     ---    * Allegra     ---    * omeprazole 20 mg delayed release capsule 2 cap(s) orally once a day, Notes: Getting some nausea  w/ 40 mg dose, would like to take 2x20 mg dose     ---    * ibuprofen 800 mg tablet 1 tab(s) orally 3 times a day     ---    * cyclobenzaprine 10 mg tablet 1 tab(s) orally HS, can increase up to 3 times a day     ---    Not-Taking    * Topamax 50 mg tablet 1 tab(s) orally 2 times a day     ---    * Celexa 40 mg tablet TAKE ONE TABLET BY MOUTH DAILY orally once a day     ---    * PREDNISONE 10 MG TABLET 6 TABS QD X 2 DAYS THEN DECREASE BY 1 TAB EVERY 2 DAYS PO     ---    Discontinued    * Celexa 20 mg tablet TAKE ONE TABLET BY MOUTH DAILY     ---      **Examination**    ---    _General Examination:_    General  alert and oriented  ,  well nourished and hydrated  ,  appropriate  attire and affect  ,  speech normal in rate, tone and volume  ,  good eye  contact  ,  thought processes logical  .    Televisit; pt not examined.          **Assessments**    ---    1\. Depression with anxiety - F41.8 (Primary)    ---      **Treatment**    ---      **1\. Depression with anxiety**    Continue Ativan tablet, 0.5 mg, 1 tab(s), orally, HS, prn, 30 days, Refills 0    Stop Venlafaxine Hydrochloride ER capsule, extended release, 37.5 mg, 1  cap(s), orally, once a day    Refill Venlafaxine Hydrochloride ER capsule, extended  release, 75 mg, 1  cap(s), orally, once a day, 90 day(s), 90 Capsule    Notes:    She is happy she was able to come off the Celexa, as it has been difficult in  the past. Hasn't noticed significant improvement, but not feeling worse  either. She has noticed improvement in her libido. No side effects. We  reviewed that she can increase the dose again if needed. She will get back to  me on the portal    .    ---        **2\. Others**    Clinical Notes: Video televisit. Total visit time 13 min of which greater than  50% was spent in counseling and education including discussion, answering  questions and planning treatment on the issues documented above.      **Follow Up**    ---    F/U any worsening or persistent symptoms.    Electronically signed by [name] , RNCFNP on 06/13/2019 at 10:51 AM EST    Sign off status: Completed        * * [facility]    [address] [place], [place] [address]    Tel: [phone]    Fax: [phone]              * * *          Progress Note: [name], NP 06/13/2019    ---    Note generated by eClinicalWorks EMR/PM Software ([email or web address])

## 2019-06-24 ENCOUNTER — Ambulatory Visit: Admitting: Surgery

## 2019-07-13 ENCOUNTER — Ambulatory Visit

## 2019-07-20 ENCOUNTER — Ambulatory Visit: Admitting: Internal Medicine

## 2019-07-20 LAB — HX COVID19 BY PCR (LGH)
CASE NUMBER: 2021013001843
HX COVID19 BY PCR: NOT DETECTED

## 2019-07-21 ENCOUNTER — Ambulatory Visit: Admitting: Family Medicine

## 2019-07-21 ENCOUNTER — Ambulatory Visit (HOSPITAL_BASED_OUTPATIENT_CLINIC_OR_DEPARTMENT_OTHER): Admitting: Family Medicine

## 2019-07-22 ENCOUNTER — Ambulatory Visit

## 2019-07-22 LAB — HX SARS COV 2 RNA BY PCR
CASE NUMBER: 2021015001642
HX SARS COV 2 RNA BY PCR: DETECTED — CR

## 2019-08-03 ENCOUNTER — Ambulatory Visit (HOSPITAL_BASED_OUTPATIENT_CLINIC_OR_DEPARTMENT_OTHER)

## 2019-08-03 NOTE — Progress Notes (Signed)
* * *    [name], [name] **DOB:** Oct 19, 1975 [phone] F) **Acc No.** [id number] **DOS:**  08/03/2019    ---        [name], [name]**    ------    74 Y old Female, DOB: 01/24/1976    [address] [place], [place] [address]    Home: [phone]    Provider: [name]        * * *    Web Encounter    ---    Answered by    Staff, Clinical    Date: 08/03/2019        Time: 08:07 AM    Caller    [name]    ------            Reason    Covid            Message                      Addressed To [facility] said I should let you know that I've tested positive for Covid. My test date was 1/15 so I've been out since then. I'm feeling better now still having diarrhea, headaches and some chest tightness with a cough.       I'm scheduled to return to work for Monday, I should be fine by then. I will call and make an appt with you if not.             Also, I've increased my Effexor I'm now at 150mg .  My dad was diagnosed with Covid the same day as I was and he has been admitted to [facility] since. He is struggling things have been up and down so of course my anxiety has been out of control but I managing as I need to get through this.             Let me know if you have any questions or if you think I should make an appt sooner.             [name]                Refills    Increase Venlafaxine Hydrochloride ER capsule, extended release,  75 mg, orally, 2 cap(s), once a day    ------          * * *        **eMessages**   From:    [name]    ------    Created:    2019-08-03 10:57:39.0    Sent:      Subject:    HG:DJMEQ    Message:    [name],        Thanks for letting me know. As long as you are improving, you don't need to  see me. But let me know if things change. I updated your dose for the Effexor  in our system. Keep me posted on how you're doing. Hope youre dad does OK. I'm  sure this is very hard for you. Good luck with everything.        [name]                    ---           * * *          Provider: [name] 08/03/2019    ---  Note generated by eClinicalWorks EMR/PM Software ([email or web address])

## 2019-08-12 ENCOUNTER — Ambulatory Visit: Admitting: Physical Medicine & Rehabilitation

## 2019-08-12 ENCOUNTER — Ambulatory Visit: Admit: 2019-08-12 | Payer: HMO

## 2019-08-12 NOTE — Progress Notes (Signed)
 .  Progress Notes  .  Patient: [name], [name]  Provider: Debera Lat  DOB: 11-19-1975 Age: 44 Y Sex: Female  .  PCP: Katheran Awe MD  Date: 08/12/2019  .  --------------------------------------------------------------------------------  .  REASON FOR APPOINTMENT  .  1. Right hip pain  .  HISTORY OF PRESENT ILLNESS  .  [facility]:   Antonieta is a 44 year old lady who presents for ongoing evaluation  of her right-sided hip pain due to greater trochanteric bursitis.  She was last seen in October and prescribed course of PT to focus  on hip ROM and strengthening, including ITB stretching for right  sided pain that had recently resolved after diagnostic greater  trochanter bursa steroid injection under fluoroscopy (70%, VAS  2/10). It was previously described to be worse after car  rides.Initially pain occurred after botox injection to  piriformis. Previous plan was to order MRI of thigh/hip to rule  out muscle tendon tear if pain recurs. Today she reports that she  did not complete her PT course due to pain. During PT sessions  her pain was stable but in the days after PT her pain would  beocme predctably worse. The pain wakes her up at night, which is  not unusual for her now. Although after the injection her leg  numbness resovled, the pain location and distribution has not  changed, still starts from upper right buttock and radiates to  lateral hip. Patient and her PTs are now requesting MRI to  evalute for muscle tear. Motrin use limited by GERD, scheduled  for endscopy. Flexeril 10 mg q8h on average once weekly. We  discussed that previously she had facet injections that did not  treat her pain. I personally reviewed her 2019 lumbar MRI: bone  marrow change on L1-2 facet bilaterallyL3-4 joint effusion on  rightL4-5 .broad base disc bulge, subarticular disc protrusion on  left that is in position to irritate the left L5 nerve root. mild  to moderate arthropathy L5-S1 mild facet  arthropathy and effusion  on right.  .  CURRENT MEDICATIONS  .  Taking Allegra 180 mg as needed  Taking Effexor , Notes: 150 mg daily  Taking Flexeril 10 mg 10 mg Tablets one tablet orally every 8  hours prn pain  Taking Motrin 800 mg as needed  Taking Omeprazole 20 MG Capsule Delayed Release 1 capsule 30  minutes before morning meal Orally Once a day  .  PAST MEDICAL HISTORY  .  Atypical facial nerve pain  .  ALLERGIES  .  Codeine Sulfate  .  SURGICAL HISTORY  .  Tonsillectomy  .  FAMILY HISTORY  .  No family history of dystonia. Mother's brother with diagnosis of  MS.  .  SOCIAL HISTORY  .  .  Tobacco  history:Never smoked  .  Marland Kitchen  Alcohol  Denies  .  HOSPITALIZATION/MAJOR DIAGNOSTIC PROCEDURE  .  No Hospitalization History.  Marland Kitchen  REVIEW OF SYSTEMS  .  PM&R:  .  Systemic    No weight loss, insomnia, snoring, fatigue, or night  sweats . Psychiatric    No anxiety, stress, excess worry. No  depression . Neuro    No headache, convulsions, seizures,  fainting, dizziness, ADD or stroke . Spine    denies . Neck    No  swollen glands, thyroid problems or carotid artery stenosis .  Urinary    No frequency, burning, urgency, foul odor or  incontinence .  Arms    No carpal tunnel nor numbness and tingling  in UE and no swelling . Legs    No numbness and tingling in LE  and no swelling . Skin    no significant rash ,or lesion .  Marland Kitchen  VITAL SIGNS  .  Pain scale 6, Ht-in 62, Wt-lbs 171, BMI 31.27, BSA 1.84, Ht-cm  157.48, Wt-kg 77.57, Wt Change -19 lb.  Marland Kitchen  EXAMINATION  .  PM&R Physical Examination:  General Appearance:Cooperative, No acute distress.  .  Mental Status:Normal orientation and comprehension.  .  General Examination:well-groomed.  Marland Kitchen  Speech:No dysarthria.  .  Auditory:Can hear me well.  .  Eyes:Can see me well.  .  Head:Normocephalic.  Marland Kitchen  Respiratory:Non-labored breathing, Symmetrical chest rise.  .  Cardiovascular:Warm extremities, No obvious cyanosis or clubbing.  .  Gastrointestinal:Non-distended  abdomen.  .  Lymphatic:No lymphadenopathy in field of examination.  .  Skin:No rash or signs of infection.  .  Gait:antalgic.  .  ASSESSMENTS  .  Other chronic pain - G89.29 (Primary)  .  Pain in right hip - M25.551  .  TREATMENT  .  Other chronic pain  MR [id number] eval glut medius tear  .  Marland Kitchen  Pain in right hip  MR [id number] eval glut medius tear   REFERRAL CJ:ARWPTYY [facility]LER[facility]thopedic Surgery   REASON:chronic right hip pain, concern for glut medius tear. MRI  pending  .  Marland Kitchen  Others  Notes: 30 minutes were spent with this patient in face-to-face  interaction with me and/or my staff in interview and coordination  of care.  .  FOLLOW UP  .  prn  .  Electronically signed by Jerilee Field , MD on  08/12/2019 at 12:36 PM EST  .  Document electronically signed by Debera Lat

## 2019-08-12 NOTE — Progress Notes (Signed)
 * * *    [name], [name] **DOB:** [birth year] (44 yo F) **Acc No.** [id number] **DOS:**  08/12/2019    ---       [name], [name]**    ------    44 Y old Female, DOB: 02/08/1976, External MRN: [id number]    Account Number: [id number]    [address] [place], [place]    Home: [phone]    Insurance: HMO [place] [employer] Payer ID: PAPER    PCP: Dr. Katheran Awe, MD Referring: Dr. Katheran Awe, MD  External Visit ID: [id number]    Appointment Facility[facility]        * * *    08/12/2019 Progress Notes: Debera LatCHN#:** [id number]    ------    ---       **Current Medications**    ---    Taking    * Allegra 180 mg as needed    ---    * Effexor , Notes: 150 mg daily    ---    * Flexeril 10 mg 10 mg Tablets one tablet orally every 8 hours prn pain    ---    * Motrin 800 mg as needed    ---    * Omeprazole 20 MG Capsule Delayed Release 1 capsule 30 minutes before morning meal Orally Once a day    ---     Past Medical History    ---      Atypical facial nerve pain.        ---      **Surgical History**    ---      Tonsillectomy    ---     **Family History**    ---      No family history of dystonia. Mother's brother with diagnosis of MS.    ---      **Social History**    ---    Tobacco    history: _Never smoked_    Alcohol    _Denies_      **Allergies**    ---      Codeine Sulfate    ---    [Allergies Verified]      **Hospitalization/Major Diagnostic Procedure**    ---      No Hospitalization History.    ---      **Review of Systems**    ---     _PM &R_:    Systemic No weight loss, insomnia, snoring, fatigue, or night sweats.  Psychiatric No anxiety, stress, excess worry. No depression. Neuro No  headache, convulsions, seizures, fainting, dizziness, ADD or stroke. Spine  denies. Neck No swollen glands, thyroid problems or carotid artery stenosis.  Urinary No frequency, burning, urgency, foul odor or incontinence. Arms No  carpal tunnel nor numbness and tingling in UE  and no swelling. Legs No  numbness and tingling in LE and no swelling. Skin no significant rash ,or  lesion.          **Reason for Appointment**    ---      1\. Right hip pain    ---      **History of Present Illness**    ---     _[facility]_:    [name] is a 44 year old lady who presents for ongoing evaluation of her right-  sided hip pain due to greater trochanteric bursitis.    She was last seen in October and prescribed course of PT to focus on hip ROM  and  strengthening, including ITB stretching for right sided pain that had  recently resolved after diagnostic greater trochanter bursa steroid injection  under fluoroscopy (70%, VAS 2/10). It was previously described to be worse  after car rides.    Initially pain occurred after botox injection to piriformis. Previous plan was  to order MRI of thigh/hip to rule out muscle tendon tear if pain recurs.    Today she reports that she did not complete her PT course due to pain. During  PT sessions her pain was stable but in the days after PT her pain would beocme  predctably worse. The pain wakes her up at night, which is not unusual for her  now. Although after the injection her leg numbness resovled, the pain location  and distribution has not changed, still starts from upper right buttock and  radiates to lateral hip. Patient and her PTs are now requesting MRI to evalute  for muscle tear.    Motrin use limited by GERD, scheduled for endscopy. Flexeril 10 mg q8h on  average once weekly.    We discussed that previously she had facet injections that did not treat her  pain.    I personally reviewed her 2019 lumbar MRI:    bone marrow change on L1-2 facet bilaterally    L3-4 joint effusion on right    L4-5 .broad base disc bulge, subarticular disc protrusion on left that is in  position to irritate the left L5 nerve root. mild to moderate arthropathy    L5-S1 mild facet arthropathy and effusion on right.      **Vital Signs**    ---    Pain  scale 6, Ht-in 62, Wt-lbs 171, BMI 31.27, BSA 1.84, Ht-cm 157.48, Wt-kg  77.57, Wt Change -19 lb.      **Examination**    ---     _PM &R Physical Examination:_    General Appearance: Cooperative, No acute distress.    Mental Status: Normal orientation and comprehension.    General Examination: well-groomed.    Speech: No dysarthria.    Auditory: Can hear me well.    Eyes: Can see me well.    Head: Normocephalic.    Respiratory: Non-labored breathing, Symmetrical chest rise.    Cardiovascular: Warm extremities, No obvious cyanosis or clubbing.    Gastrointestinal: Non-distended abdomen.    Lymphatic: No lymphadenopathy in field of examination.    Skin: No rash or signs of infection.    Gait: antalgic.         **Assessments**    ---    1\. Other chronic pain - G89.29 (Primary)    ---    2\. Pain in right hip - M25.551    ---      **Treatment**    ---      **1\. Other chronic pain**    _IMAGING: MR PELVIS_   eval glut medius tear    ------         **2\. Pain in right hip**    _IMAGING: MR PELVIS_   eval glut medius tear    ------        Referral ZO:XWRUEAV SALZLER Orthopedic Surgery    Reason:chronic right hip pain, concern for glut medius tear. MRI pending             **3\. Others**    Notes: 30 minutes were spent with this patient in face-to-face interaction  with me and/or my staff in interview and coordination of care.     **Follow Up**    ---  prn    Electronically signed by Jerilee Field , MD on 08/12/2019 at 12:36 PM EST    Sign off status: Completed        * * *        [facility]    [address]    [place] [address]    [place], [place] [address]    Tel: [phone]    Fax: [phone]              * * *          Progress Note: Debera Lat 08/12/2019    ---    Note generated by eClinicalWorks EMR/PM Software ([email or web address])

## 2019-08-18 ENCOUNTER — Ambulatory Visit

## 2019-09-01 ENCOUNTER — Ambulatory Visit (HOSPITAL_BASED_OUTPATIENT_CLINIC_OR_DEPARTMENT_OTHER)

## 2019-09-01 NOTE — Progress Notes (Signed)
* * *    [name], [name] **DOB:** [birth year] (45 yo F) **Acc No.** [id number] **DOS:**  09/01/2019    ---        [name], [name]**    ------    3 Y old Female, DOB: [birth year]    [address] [place], [place] [address]    Home: [phone]    Provider: Darryl [name]        * * *    Web Encounter    ---    Answered by    Staff, Clinical    Date: 09/01/2019        Time: 08:57 AM    Caller    [name]    ------            Reason    Medication refills            Message                      Addressed To [name]      Please refill my Effexor XR 150mg  I am completely out. I did put a refill request in but the meds have not been refilled. Not sure why is there something else I need to do?                Action Taken                      [name]  09/01/2019 9:11:40 AM > CPE current, last FU appt 06/13/19, no pending appts.      [name],[name]  09/01/2019 11:34:02 AM > Pt is completely out of med, states she doesn't feel well if she doesn't take      [name] 09/01/2019 11:41:14 AM > Find out if she's taking 1x75 mg or 2x75 mg. I sent it in for 90 days in Dec 7. I'll send it in right when I hear from her. Also never rec'd refill request.      [name]  09/01/2019 11:51:59 AM > called pt, she states she takes 150mg  daily so either 1-150mg  or 2-75mg  would be fine, states the dose was increased to 150mg  a few weeks back so that is why she is out of meds a little early.                Refills    Stop Venlafaxine Hydrochloride ER capsule, extended release, 75  mg, orally, 2 cap(s), once a day    ------      Start Venlafaxine Hydrochloride ER capsule, extended release, 150 mg,  orally, 90 Capsule, 1 cap(s), once a day, 90 day(s), Refills=3          * * *        **eMessages**   From:    [name]    ------    Created:    2019-09-01 12:28:03.0    Sent:    2019-09-01 12:28:04.0    Subject:    AY:GEFUWTKTCC refills    Message:    [name],        I sent in the new RX for 150 mg  Venlafaxine, one tab once a day. Sorry for the  delay. I was busy seeing patients.        [name]                    ---          * * *  Provider: Darryl [name] 09/01/2019    ---    Note generated by eClinicalWorks EMR/PM Software ([email or web address])

## 2019-09-08 ENCOUNTER — Ambulatory Visit (HOSPITAL_BASED_OUTPATIENT_CLINIC_OR_DEPARTMENT_OTHER)

## 2019-09-08 NOTE — Progress Notes (Signed)
* * *      Curnow, Tiena **DOB:** [birth year] (44 yo F) **Acc No.** [id number] **DOS:**  09/08/2019    ---        [name], [name]**    ------    60 Y old Female, DOB: 04/07/1976    [address], [place], [place] [address]    Home: [phone]    Provider: [name]        * * *    Web Encounter    ---    Answered by    Staff, Clinical    Date: 09/08/2019        Time: 06:34 PM    Caller    Mardee Beeghly    ------            Reason    New Refill Request            Message                      Addressed To        ***Start of Medication List: ***       Ativan      0.5 mg      1 tab(s)      HS, prn      ,      ibuprofen      800 mg      1 tab(s)      3 times a day       ***End of Med List ***        Comment :                 Action Taken                      [name], RN ,[name] 09/09/2019 7:07:59 AM > cpe 05/04/2019      Sol Englert B 09/09/2019 5:22:50 PM > [place] PAT 10/05/2018. No signs of diversion.                 Refills    Refill Ativan tablet, 0.5 mg, orally, 30, 1 tab(s), HS, prn,  Refills=0    ------      Refill ibuprofen tablet, 800 mg, orally, 270, 1 tab(s), 3 times a day prn,  90 days          * * *        **eMessages**   From:    Keghan Mcfarren    ------    Created:    2019-09-09 17:25:59.0    Sent:      Subject:    RE:New Refill Request    Message:    [name],        I sent in both RXes.        [name]                    ---          * * *          Provider: [name] 09/08/2019    ---    Note generated by eClinicalWorks EMR/PM Software ([email or web address])

## 2019-10-18 ENCOUNTER — Ambulatory Visit

## 2019-11-18 ENCOUNTER — Ambulatory Visit: Admitting: Physical Medicine & Rehabilitation

## 2019-11-18 NOTE — Progress Notes (Signed)
* * *      [name], [name] **DOB:** Oct 19, 1975 [phone] yo F) **Acc No.** [id number] **DOS:**  11/18/2019    ---       [name], [name]**    ------    86 Y old Female, DOB: [birth year]    [address], [place], [place] [address]    Home: [phone]    Provider: Debera Lat        * * *    Telephone Encounter    ---    Answered by  Inocencio Homes Date: 11/18/2019       Time: 11:54 AM    Reason  Botox, Appt: 06/07    ------            Message                     - [employer] approved Botox under medical benefits. Authorization# [id number], Valid:11/16/2019-11/15/2020. *Buy and bill*                    * * *                ---          * * *         Provider: Debera Lat 11/18/2019    ---    Note generated by eClinicalWorks EMR/PM Software ([email or web address])

## 2019-11-24 ENCOUNTER — Ambulatory Visit

## 2019-12-08 ENCOUNTER — Ambulatory Visit

## 2019-12-12 ENCOUNTER — Ambulatory Visit (HOSPITAL_BASED_OUTPATIENT_CLINIC_OR_DEPARTMENT_OTHER): Admitting: Psychiatry

## 2019-12-12 ENCOUNTER — Ambulatory Visit

## 2019-12-12 ENCOUNTER — Ambulatory Visit: Admit: 2019-12-12 | Payer: HMO

## 2019-12-12 NOTE — Progress Notes (Signed)
 * * *    [name], [name] **DOB:** 03/14/1976 (44 yo F) **Acc No.** [id number] **DOS:**  12/12/2019    ---       [name], [name]**    ------    21 Y old Female, DOB: 15-Jul-1975, External MRN: [id number]    Account Number: [id number]    [address], [place], [id number]    Home: [phone]    Insurance: HMO [place] [employer] Payer ID: PAPER    PCP: Dr. Katheran Awe, MD Referring: Dr. Katheran Awe, MD  External Visit ID: [id number]    [facility]        * * *    12/12/2019 Progress Notes: Carola Frost, MD **CHN#:** [id number]    ------    ---       **Current Medications**    ---    Taking    * Allegra 180 mg as needed    ---    * Effexor , Notes: 150 mg daily    ---    * Flexeril 10 mg 10 mg Tablets one tablet orally every 8 hours prn pain    ---    * Motrin 800 mg as needed    ---    * Omeprazole 20 MG Capsule Delayed Release 1 capsule 30 minutes before morning meal Orally Once a day    ---     Past Medical History    ---      Atypical facial nerve pain.        ---      **Surgical History**    ---      Tonsillectomy    ---     **Family History**    ---      No family history of dystonia. Mother's brother with diagnosis of MS.    ---      **Social History**    ---    Tobacco history: Never smoked.    Alcohol  Denies.     **Allergies**    ---      Codeine Sulfate    ---       **Reason for Appointment**    ---      1\. ? 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 She's  had an MRI of her hip by Dr Jonathon Bellows, results pending. She says the previous  Botox injection helped "tremendously" and she denies any adverse reactions to  it. She says it began hurting her again 2 weeks ago and points to the R  piriformis. She denies current radicular symptoms or weakness.      **Assessments**    ---    1\. 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 Other dystonia**    Notes:    Risks and benefits of Botox Intramuscular Neurolysis were discussed with the  patient, and informed consent was obtained.    PROCEDURE: The patient was positioned in optimal position for administration  of the Botox. The skin was cleaned with alcohol at each injection site. Using  a 27 gauge Teflon coated injection needle and EMG guidance, the following  muscles were localized and injected with the specified amounts of  Botox, with  bloodless aspirate noted prior to each injection:        Right piriformis: 100 units    Needle electromyography demonstrated marked motor unit activity at rest in  each of the above muscles. The patient tolerated the procedure well.    Effect of the Botox should be evident within 5-7 days with full benefit by 2-3  weeks. She'll call when it is wearing off.    ---     **Procedure Codes**    ---      95874 EMG GUIDANCE FOR CHEMODENERVATION    ---    40102 CHEMODENERV 1 EXTREMITY 1-4    ---    V2536 BOTULINUM TOXIN TYPE A PER UNIT, Units: 100.00    ---     **Follow Up**    ---    prn    Electronically signed by Carola Frost , MD on 12/12/2019 at 03:03 PM EDT    Sign off status: Completed        * * *        [facility]    [address]    [address]    [place], [place] [address]    Tel: [phone]    Fax: [phone]              * * *          Progress Note: Carola Frost, MD 12/12/2019    ---    Note generated by eClinicalWorks EMR/PM Software ([email or web address])

## 2019-12-12 NOTE — Progress Notes (Signed)
.    Progress Notes  .  Patient: [name], [name]  Provider: Carola Frost    .  DOB: 04-24-1976 Age: 44 Y Sex: Female  .  PCP: Katheran Awe MD  Date: 12/12/2019  .  --------------------------------------------------------------------------------  .  REASON FOR APPOINTMENT  .  1. ? REPEAT BTX  .  HISTORY OF PRESENT ILLNESS  .  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 She's had an MRI of her hip by Dr Jonathon Bellows, results  pending. She says the previous Botox injection helped  "tremendously" and she denies any adverse reactions to it. She  says it began hurting her again 2 weeks ago and points to the R  piriformis. She denies current radicular symptoms or weakness.  .  CURRENT MEDICATIONS  .  Taking Allegra 180 mg as needed  Taking Effexor , Notes: 150 mg daily  Taking Flexeril 10 mg 10 mg Tablets one tablet orally every 8  hours prn pain  Taking Motrin 800 mg as needed  Taking Omeprazole 20 MG Capsule Delayed Release 1 capsule 30  minutes before morning meal Orally Once a day  .  PAST MEDICAL HISTORY  .  Atypical facial nerve pain  .  ALLERGIES  .  Codeine Sulfate  .  SURGICAL HISTORY  .  Tonsillectomy  .  FAMILY HISTORY  .  No family history of dystonia. Mother's brother with diagnosis of  MS.  .  SOCIAL HISTORY  .  .  Tobaccohistory:Never smoked.  .  Alcohol Denies.  .  ASSESSMENTS  .  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  TREATMENT  .  Other dystonia  Notes:  .  Risks and benefits of Botox Intramuscular Neurolysis were  discussed with the patient, and informed consent was obtained.  Marland Kitchen  PROCEDURE: The patient was positioned in optimal position for  administration of the Botox. The skin was cleaned with alcohol at  each injection site. Using a 27 gauge Teflon coated injection  needle and EMG guidance, the following muscles were localized and  injected with the specified amounts of Botox, with bloodless  aspirate noted prior to each  injection:  .  Marland Kitchen  Right piriformis: 100 units  .  Needle electromyography demonstrated marked motor unit activity  at rest in each of the above muscles. The patient tolerated the  procedure well.  .  Effect of the Botox should be evident within 5-7 days with full  benefit by 2-3 weeks. She'll call when it is wearing off.  Marland Kitchen  PROCEDURE CODES  .  16109 EMG GUIDANCE FOR CHEMODENERVATION  .  60454 CHEMODENERV 1 EXTREMITY 1-4  .  U9811 BOTULINUM TOXIN TYPE A PER UNIT, Units: 100.00  .  FOLLOW UP  .  prn  .  Electronically signed by Carola Frost , MD on  12/12/2019 at 03:03 PM EDT  .  Document electronically signed by Carola Frost    .

## 2019-12-23 ENCOUNTER — Ambulatory Visit

## 2020-01-11 ENCOUNTER — Ambulatory Visit

## 2020-01-25 ENCOUNTER — Ambulatory Visit (HOSPITAL_BASED_OUTPATIENT_CLINIC_OR_DEPARTMENT_OTHER): Admitting: Family Medicine

## 2020-01-25 NOTE — Progress Notes (Signed)
* * *      [name], [name] **DOB:** [birth year] (44 yo F) **Acc No.** [id number] **DOS:**  01/25/2020    ---        [name], [name]**    ------    62 Y old Female, DOB: [birth year]    [address] [place], [place] [address]    Home: [phone]    Provider: Manus Rudd        * * *    Web Encounter    ---    Answered by    Staff, Clinical    Date: 01/25/2020        Time: 06:35 PM    Caller    [name]    ------            Reason    New Refill Request            Message                      Addressed To        ***Start of Medication List: ***       Ativan      0.5 mg      1 tab(s)      HS, prn       ***End of Med List ***        Comment :                 Action Taken                      [name]  01/26/2020 8:23:33 AM > checked [facility], last script written and filled 09/09/19.  Pt usually sees [name], CPE current, last FU appt 06/13/2019.      [name],[name] 01/26/2020 7:10:02 PM > sent.   FYI to KP      [name],[name] 01/27/2020 4:35:09 PM > Noted.                Refills    Refill Ativan tablet, 0.5 mg, orally, 30, 1 tab(s), HS, prn, 30  days, Refills=0    ------          * * *                ---          * * *          Provider: Manus Rudd 01/25/2020    ---    Note generated by eClinicalWorks EMR/PM Software ([email or web address])

## 2020-01-30 ENCOUNTER — Ambulatory Visit (HOSPITAL_BASED_OUTPATIENT_CLINIC_OR_DEPARTMENT_OTHER): Admitting: Family Medicine

## 2020-01-30 NOTE — Progress Notes (Signed)
* * *    [name], [name] **DOB:** [birth year] (44 yo F) **Acc No.** [id number] **DOS:**  01/30/2020    ---        [name], [name]**    ------    72 Y old Female, DOB: 23-Oct-1975    Account Number: [id number]    [address], [place], [id number]    Home: [phone]    Guarantor: [name] Insurance: BCBS-[place]: HMO [employer]UE [place]  Payer ID: [id number]    External Visit ID: [id number]    Appointment Facility: [facility]        * * *    01/30/2020    Progress Notes: Ruben Reason. Mosetta Anis MD    ------    ---        **Current Medications**    ---    Taking      * Fioricet 325 mg-50 mg-40 mg tablet 2 tab(s) orally Q4H maximum 6 per day prn     ---    * ProAir HFA CFC free 90 mcg/inh aerosol with adapter 2 puff(s) inhaled 20-30 min before exercise     ---    * Astelin 137 mcg/inh spray 2 spray(s) intranasally 2 times a day     ---    * albuterol 90 mcg/inh aerosol 2 puff(s) inhaled every 6 hours     ---    * Allegra     ---    * omeprazole 20 mg delayed release capsule 2 cap(s) orally once a day, Notes: Getting some nausea w/ 40 mg dose, would like to take 2x20 mg dose     ---    * cyclobenzaprine 10 mg tablet 1 tab(s) orally HS, can increase up to 3 times a day     ---    * Venlafaxine Hydrochloride ER 150 mg capsule, extended release 1 cap(s) orally once a day     ---    * ibuprofen 800 mg tablet 1 tab(s) orally 3 times a day prn     ---    * Ativan 0.5 mg tablet 1 tab(s) orally HS, prn     ---    * Liletta 52 mg device 1 ea by intrauterine administration once, Notes: insertion 01/30/2020     ---    Not-Taking    * Topamax 50 mg tablet 1 tab(s) orally 2 times a day     ---    * CeleXA 40 mg tablet TAKE ONE TABLET BY MOUTH DAILY orally once a day     ---    * PREDNISONE 10 MG TABLET 6 TABS QD X 2 DAYS THEN DECREASE BY 1 TAB EVERY 2 DAYS PO     ---    Discontinued    * Buproban 150 mg/12 hours tablet, extended release 1 tab(s) orally 2 times a day     ---    * CeleXA 20 mg tablet 1 tab orally QD     ---     * Fioricet 325 mg-50 mg-40 mg tablet 2 tab(s) orally Q4H maximum 6 per day     ---    * ProAir HFA CFC free 90 mcg/inh aerosol with adapter 2 puff(s) inhaled 20-30 min before exercise     ---    * Retin-A 0.1% cream 1 app applied topically once a day (at bedtime)     ---    * Wellbutrin XL 150 mg/24 hours 2 tablet, extended release 1 tab(s) to start, then gradually increase  to 2 tablets orally every 24 hours     ---    * ZyrTEC 10 mg tablet 1 tab(s) orally once a day     ---    * Medication List reviewed and reconciled with the patient     ---      Past Medical History    ---      anxiety - stable w/Celexa.        ---    mirena IUD 7/06, 7/11.        ---    anemia w/ preg, normal hgb at [facility] visit.        ---    GERD - uses OTC Prilosec prn.        ---    asthma, mild - occ use of ProAir.        ---    migraines - respond to Fioricet, not often.        ---    BMD 2009 - normal.        ---    PAP SMEAR (ASC-US) with neg HPV 2008, all repeat PAPs WNL.        ---    + Covid Jan 2021.        ---      **Surgical History**    ---      Tonsillectomy 2002    ---    Lasik 2005    ---      **Family History**    ---      Father: alive 10 yrs, anxiety, basal cell removed, depression,  cardiomyopathy, diagnosed with Heart Disease, Cancer    ---    Mother: deceased 33 yrs, hypertension, Diabetes, hypothyroid, renal cell CA,  orthostatic tremor, MI. stents, CHF, died of cardiac arrest, Diabetes,  Hypertension, Heart Disease    ---    Sibling 1: alive 48 yrs, biological brother, drug addiction; had 2 sisters and  4 brothers - one sister HIV who is dying; lost a sister to a brain aneurysm  (half sister), bio brother is an addict, 1/2 brother dying of ETOH and drug  induced liver disease, one 1/2 brothers one w/ulcerative colitis, one 1/2  brother has anxiety but is healthy, Mental Illness    ---    Maternal Grand Mother: deceased 16 yrs, died of an MI    ---    Aunt(s): deceased 2 yrs, cardiogenic shock and respiratory arrest  from an MI,  smoker    ---    has 1 bio brother, 1 half sis died, has another sister and 3 half brothers;  cousin deceased from [place] age 59. No breast, ovarian or colon cancers. Has 1  full brother and 5 half siblings; all healthy; half brother 32 yrs died liver  disease.    ---      **Social History**    ---    no Tobacco Use  Status: never smoker  , Patient counseled on the dangers of  tobacco use: 08/16/2018.    Seat belt: yes.    Alcohol: socially.    no Drug use, CBD oil on her back, helps.    Diet/Nutrition: "gained the freshman 40," and more thereafter, daily yogurt,  no supplements, daily fruits and vegetables, whole grains, problem is portion  sizes and snacking/picking.    no Exercise.    Marital Status: married, "awesome".    no Domestic violence.    Children: 2, son, [name], 42 yrs and daughter, [name] 15 yrs.  Number of household members: 5, self, spouse, 2 children,and a 4yo foster  child.    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    no Travel outside [place].    Religion: Catholic.    Caffeine: yes, 1 cups coffee per day.    Home smoke detector use: yes.    Pets: yes, 1 dog.    Sexual orientation: heterosexual.    Sexually active: yes.      **Allergies**    ---      codeine: vomiting    ---      **Hospitalization/Major Diagnostic Procedure**    ---      No Hospitalization History.    ---        **Reason for Appointment**    ---      1\. iud removal and placement -; pt aware that Penni Bombard is what MFM is now  offering and is okay with it    ---      **History of Present Illness**    ---    _GYN_ :    44 year old female presents with c/o CONTRACEPTION  Mirena removal, Liletta  insert  .    Denies : ABDOMINAL PAIN. Denies : BREAST COMPLAINTS. Denies : PELVIC PAIN.  Denies : CRAMPING.    She continues to desire unintended pregnancy.      **Vital Signs**    ---    HR **108** , BP **120/70** , O2 Sat. **99** , Ht 62, Wt **160** , Wt Change  -39 lb, BMI  **29.26** .       **Examination**    ---    Denton Meek Examination:_    Female Genitourinary:  no lesions, strings in place.  .    Extremities  normal ROM  .          **Assessments**    ---    1\. Encounter for removal and reinsertion of intrauterine contraceptive device  (IUD) - Z30.433 (Primary)    ---      **Treatment**    ---      **1\. Encounter for removal and reinsertion of intrauterine contraceptive  device (IUD)**    _LAB: UCG_ neg      Value    Reference Range    ---------    NEG    neg          [name],Jasmina Gendron H, JR 02/08/2020 1:16:47 AM >    ------        Notes:    consent given for IUD both removal and insertion of new device. I advised the  patient again of her options and she has decided on insertion of Liletta IUD.    .      **Procedure Codes**    ---      [id number] UCG    ---    43329 IUD INSERT    ---    51884 IUD REMOVAL    ---    Z6606 LNG-RELEASING IUC SYS 52MG  3 YR DUR    ---      **Follow Up**    ---    4 Weeks    Electronically signed by Celene Squibb, MD on 02/12/2020 at 10:04 PM EDT    Sign off status: Completed        * * [facility]    [address] [place], [place] [address]    Tel: [phone]  Fax: [phone]              * * *          Progress Note: Ruben Reason. Mosetta Anis MD 01/30/2020    ---    Note generated by eClinicalWorks EMR/PM Software ([email or web address])

## 2020-03-23 ENCOUNTER — Ambulatory Visit (HOSPITAL_BASED_OUTPATIENT_CLINIC_OR_DEPARTMENT_OTHER): Admitting: Family Medicine

## 2020-03-23 NOTE — Progress Notes (Signed)
* * *      [name], [name] **DOB:** Apr 29, 1976 [phone] yo F) **Acc No.** [id number] **DOS:**  03/23/2020    ---        [name], [name]**    ------    75 Y old Female, DOB: 1976-06-22    [address], [place], [place] [address]    Home: [phone]    Provider: Manus [name]        * * *    Telephone Encounter    ---    Answered by    [name]    Date: 03/23/2020        Time: 12:35 PM    Reason    RxRf Albuterol inhaler    ------            Message                      LV-01/30/20      NV-05/23/20                Action Taken                      [name],[name]  03/23/2020 12:37:21 PM > Rx sent with 1 RF.      [name],[name]  03/23/2020 2:35:41 PM > noted                Refills    Refill albuterol aerosol, 90 mcg/inh, inhaled, 1, 2 puff(s), every  6 hours, 30 day(s)    ------          * * *                ---          * * *          Provider: [name], [name] 03/23/2020    ---    Note generated by eClinicalWorks EMR/PM Software ([email or web address])

## 2020-03-30 ENCOUNTER — Ambulatory Visit (HOSPITAL_BASED_OUTPATIENT_CLINIC_OR_DEPARTMENT_OTHER)

## 2020-04-05 ENCOUNTER — Ambulatory Visit (HOSPITAL_BASED_OUTPATIENT_CLINIC_OR_DEPARTMENT_OTHER): Admitting: Family Medicine

## 2020-04-05 NOTE — Progress Notes (Signed)
* * *      [name], [name] **DOB:** [birth year] (44 yo F) **Acc No.** [id number] **DOS:**  04/05/2020    ---        [name], [name]**    ------    85 Y old Female, DOB: [birth year]    [address], [place], [place] [address]    Home: [phone]    Provider: Manus Rudd        * * *    Telephone Encounter    ---    Answered by    Sherle Poe    Date: 04/05/2020        Time: 09:50 AM    Reason    pantoprazole    ------            Action Taken                      [name],[name]  04/05/2020 9:50:09 AM > spoke to pt who states her GI swtiched her to pantoprazole and is having an endoscopy on 04/11/20      [name],[name] 04/05/2020 10:24:32 AM > OK. Great. TY.                    * * *                ---          * * *          Provider: Manus Rudd 04/05/2020    ---    Note generated by eClinicalWorks EMR/PM Software ([email or web address])

## 2020-04-11 ENCOUNTER — Ambulatory Visit

## 2020-04-11 ENCOUNTER — Ambulatory Visit: Admitting: Internal Medicine

## 2020-04-12 LAB — HX SURG PATH FINAL REPORT: CASE NUMBER: 0

## 2020-05-23 ENCOUNTER — Ambulatory Visit (HOSPITAL_BASED_OUTPATIENT_CLINIC_OR_DEPARTMENT_OTHER)

## 2020-05-23 ENCOUNTER — Encounter

## 2020-05-23 NOTE — Progress Notes (Signed)
* * *    [name], [name] **DOB:** [birth year] [phone] yo F) **Acc No.** [id number] **DOS:**  05/23/2020    ---        [name], [name]**    ------    44 Y old Female, DOB: [birth year]    Account Number: [id number]    [address] [place], [place]-[id number]    Home: [phone]    Guarantor: [name] Insurance: [employer]: HMO BLUE [place] - OPTIONS    PCP: [name]    Appointment Facility: [facility]        * * *    05/23/2020    Progress Notes: [name] Haggard, NP    ------    ---        **Reason for Appointment**    ---      1\. Yearly Physical    ---    2\. Gad 7    ---      **History of Present Illness**    ---    _Female CPE/Screening_ :    She is doing well. Loves Effexor. 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 Sxs don't seem anxiety provoked to her. SHe is getting an esophageal  manometry study scheduled. She already had an EGD which was normal. She was  put on Lomaira for weight loss. She tried it for awhile. Meanwhile dad was in  the hospitalized w/Covid and she ended up losing weight but it didn't help her  dyspepsia. She is seeing a therapist, [name] in [place]. Thinks she is  wonderful.    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 Has  helped her immensely. Cortisone injections didn't help.    Recent consults:  Pain clinic-04/01/19 recieved a depo-medrol injection for  bursitis; GI THV on 12/07/19 and 05/02/20 doing better with diet change and FD  [name] ordered an esophageal manometry, working on weight mgt, ? tapering off  effexor and starting TCA for dysfunctional dyspepsia  . PAP/HPV:  Date done -  04/21/18  Negative, rpt due 2014  ,  No GU s/s, declines pelvic exam  .  Mammogram:  Date done - 07/21/19  Normal  . Immunizations:  Tdap up to date  she had her flu shot  ,  Had 2 Pfizer Covid vaccines + booster  . Recent  labs/imaging:  EGD-11/22/19 -MRI pelvis neg  .  Periods  Lilette IUD July 2021,  good for 6 years, no periods  . Contraception:  IUD LILETTA PLACED 01/30/20  .  Skin Concerns  none  . Dental care:  UTD  . Vision care:  UTD, had lasik  surgery  .    _*GAD-7_ :    GAD Scale Feeling nervous, anxious, or on edge? **Several days: 1** , Not  being able to stop or control worrying? **Not at all: 0** , Trouble relaxing?  **Not at all: 0** , Being so restless that it's hard to sit still? **Not at  all: 0** , Becoming easily annoyed or irritable? **Not at all: 0** , Feeling  afraid as if something awful might happen: **Not at all: 0** .    _Depression Screening_ :    PHQ-2 (2015 Edition) Little interest or pleasure in doing things? **Not at  all** , Feeling down, depressed, or hopeless? **Not at all** , Total Score  **0** .      **Current Medications**    ---  Taking      * pantoprazole 40 mg delayed release tablet 1 tab(s) orally once a day     ---    * Effexor XR 150 mg capsule, extended release 1 cap(s) orally once a day     ---    * Fioricet 325 mg-50 mg-40 mg tablet 2 tab(s) orally Q4H maximum 6 per day prn     ---    * Ativan 0.5 mg tablet 1 tab(s) orally HS, prn     ---    * ProAir HFA CFC free 90 mcg/inh aerosol with adapter 2 puff(s) inhaled 20-30 min before exercise     ---    * Astelin 137 mcg/inh spray 2 spray(s) intranasally 2 times a day     ---    * ibuprofen 800 mg tablet 1 tab(s) orally 3 times a day     ---    Not-Taking    * CeleXA 20 mg tablet TAKE ONE TABLET BY MOUTH DAILY     ---    * Venlafaxine Hydrochloride ER 37.5 mg capsule, extended release 1 cap(s) orally once a day     ---    * omeprazole 20 mg delayed release capsule 2 cap(s) orally once a day, Notes: Getting some nausea w/ 40 mg dose, would like to take 2x20 mg dose     ---    * cyclobenzaprine 10 mg tablet 1 tab(s) orally HS, can increase up to 3 times a day     ---    * Medication List reviewed and reconciled with the patient     ---      **Past Medical History**    ---      anxiety -  stable w/Celexa.        ---    mirena IUD 7/06, 7/11.        ---    anemia w/ preg, normal hgb at [facility] visit.        ---    GERD - uses OTC Prilosec prn.        ---    asthma, mild - occ use of ProAir.        ---    migraines - respond to Fioricet, not often.        ---    BMD 2009 - normal.        ---    PAP SMEAR (ASC-US) with neg HPV 2008, all repeat PAPs WNL.        ---    + Covid Jan 2021.        ---      **Surgical History**    ---      Tonsillectomy 2002    ---    Lasik 2005    ---      **Family History**    ---      Father: alive 27 yrs, anxiety, basal cell removed, depression,  cardiomyopathy, diagnosed with Cancer, Heart Disease    ---    Mother: deceased 43 yrs, hypertension, Diabetes, hypothyroid, renal cell CA,  orthostatic tremor, MI. stents, CHF, died of cardiac arrest, diagnosed with  Diabetes, Heart Disease, Hypertension    ---    Sibling 1: alive 48 yrs, biological brother, drug addiction; had 2 sisters and  4 brothers - one sister HIV who is dying; lost a sister to a brain aneurysm  (half sister), bio brother is an addict, 1/2 brother dying of ETOH and drug  induced liver disease, one  1/2 brothers one w/ulcerative colitis, one 1/2  brother has anxiety but is healthy, diagnosed with Mental Illness    ---    Maternal Grand Mother: deceased 58 yrs, died of an MI    ---    Aunt(s): deceased 36 yrs, cardiogenic shock and respiratory arrest from an MI,  smoker    ---    has 1 bio brother, 1 half sis died, has another sister and 3 half brothers;  cousin deceased from [place] age 39. No breast, ovarian or colon cancers. Has 1  full brother and 5 half siblings; all healthy; half brother 54 yrs died liver  disease.    ---      **Social History**    ---    no Tobacco Use  Status: **never smoker** , Patient counseled on the dangers of  tobacco use: **05/23/2020** .    Seat belt: yes.    Alcohol: socially.    no Drug use, CBD oil on her back, helps.    Diet/Nutrition: has lost weight through stress and not  eating, also IBS  contributing.    Exercise: yes, was going back to they gym, has lost weight, she is active.    Marital Status: married, "awesome".    no Domestic violence.    Children: 2, son, [name], 95 yrs and daughter, [name] 15 yrs.    Number of household members: 5, self, spouse, 2 children,and a 18 yo foster  child.    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    no Travel outside [place].    Religion: Catholic.    Caffeine: yes, 1 cups coffee per day.    Home smoke detector use: yes.    Pets: yes, 1 dog.    Sexual orientation: heterosexual.    Sexually active: yes.      **Allergies**    ---      codeine: vomiting    ---      **Hospitalization/Major Diagnostic Procedure**    ---      Denies Past Hospitalization    ---      **Vital Signs**    ---    HR **97** , BP **110/78** , O2 Sat. **99** , Ht 62, Wt **165** , Wt Change 5  lb, BMI  **30.18** .      **Examination**    ---    _General Examination:_    General  well nourished, well-hydrated female in  NAD  ,  appropriate attire  and affect  .    HEENT:  TMs and ear canals clear, conjunctiva clear, nares patent, oropharynx  clear with MMM  .    Oral cavity:  clear  ,  moist mucus membranes  .    Neck, Thyroid :  supple  ,  no thyromegaly  ,  no lymphadenopathy  .    Lymph nodes  none  .    Chest:  normal shape and expansion  .    Breasts :  no lumps felt on either side  ,  no dimpling  ,  no skin changes  ,  no axillary lymphadenopathy  .    Heart:  RRR  ,  no murmurs  .    Lungs:  clear to auscultation bilaterally  .    Abdomen:  soft, NT/ND, BS present  ,  no hepatosplenomegaly  ,  no masses  palpated  .    Extremities  normal ROM  ,  no edema; tender L lateral epicondyle  .  Peripheral pulses:  normal (2+) bilaterally  .    Skin:  moist, warm  .    Neurologic Exam:  non-focal exam  .    Psych  normal affect and mood  .          **Assessments**    ---    1\. Routine medical exam - Z00.00 (Primary)    ---    2\. Depression with  anxiety - F41.8    ---    3\. 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 Acne - L70.9    ---    5\. Migraine headache - G43.909    ---    6\. 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 Allergic rhinitis - J30.9    ---    8\. GERD (gastroesophageal reflux disease) - K21.9    ---    9\. Chronic low back pain - M54.5    ---    10\. Obesity (BMI 30.0-34.9) - E66.9    ---    11\. BMI 30.0-30.9,adult - Z68.30    ---      **Treatment**    ---      **1\. Routine medical exam**    _LAB: Hemoglobin A1c_ 5.2      Value    Reference Range    ---------    Mean Bld Glucos    103      \- mg/dL    Glycated Hgb    5.2      <=5.6 - %    ------------    _LAB: Lipid Panel_ TC 239, HDL 47, LDL 163, Trig 146    Value    Reference  Range    ---------    Chol    239    H    <=200 - mg/dL    HDL    47      >=96 - mg/dL    ------------    Trig    146      <=150 - mg/dL    ------------    LDL    163    H    <=130 - mg/dL    ------------    Status    Fasting      \-    ------------      Notes :Aveleen Nevers B 06/15/2020 4:00:00 PM > 10 year ASCVD risk = 0.9% -  low risk, no indication for medication, continue healthy diet and regular  exercise    ------        Notes: Immunizations UTD. Flu shot was already rec'd. PAP and mammogram UTD.  Rev'd PHQ9. Discussed diet and exercise. She has lost weight. Will check  screening labs.        **2\. Depression with anxiety**    Continue Ativan tablet, 0.5 mg, 1 tab(s), orally, HS, prn, 30 days, Refills 0    Continue Venlafaxine Hydrochloride ER capsule, extended release, 37.5 mg, 1  cap(s), orally, once a day, 90 days, 90 Capsule, Refills 3    Notes:    She is doing well on Effexor. Mood is much better. "I love it." No sexual side  effects, feeling less flat, less break through anxiety. Hasn't used ativan  since her mom died.  .            **3\. Exercise-induced asthma**    Continue ProAir HFA aerosol with adapter, CFC free 90 mcg/inh,  2 puff(s),  inhaled, 20-30  min before exercise    Notes:    Rarely needs inhaler.  .            **4\. Acne**    Notes:    Stable. Treats w/OTCs  .            **5\. Migraine headache**    Continue Fioricet tablet, 325 mg-50 mg-40 mg, 2 tab(s), orally, Q4H maximum 6  per day prn    Notes:    They have been fine since she has been keeing up with astelin and flonase  through allergist.  .            **6\. Attention deficit disorder of adult**    Refill Concerta tablet, extended release, 27 mg/24 hr, 1 tab(s), orally, once  a day (in the morning), 90 day(s), 90, Refills 0    Notes:    Would like to restart her Concerta. Staff has mentioned that she is scattered.  She realizes it is true. She signed contract and left urine for UTOX. Checked  [place] PAT.  .            **7\. Allergic rhinitis**    Continue Astelin spray, 137 mcg/inh, 2 spray(s), intranasally, 2 times a day    Notes:    Seasonal. Taking Astelin and Flonase  .            **8\. GERD (gastroesophageal reflux disease)**    Refill pantoprazole delayed release tablet, 40 mg, 1 tab(s), orally, once a  day, 90 days, 90 Tablet, Refills 3    Notes:    GI sxs better on pantoprazole. Seeing GI. Scheduled for manometry  .            **9\. Chronic low back pain**    Refill ibuprofen tablet, 800 mg, 1 tab(s), orally, 3 times a day, 90 days, 270  Tablet    Continue cyclobenzaprine tablet, 10 mg, 1 tab(s), orally, HS, can increase up  to 3 times a day    Notes:    Sees Dr. Jonathon Bellows, physiatrist. Responded well to Botox into the piriformis.  Very pleased. Goes every 13 weeks.  .            **10\. Obesity (BMI 30.0-34.9)**    Notes:    Weight better. Encouraged her to be active w/her kids  .            **Labs**    ------    _Lab: Drug screen_ Pos THC    ---        Value    Reference Range    ---------    THC    pos        COCAINE    neg        ------------    MOP    neg        ------------    MET    neg        ------------    Amphetamine    neg         ------------    BENZO    neg        ------------    BARB    neg        ------------    MTD    neg        ------------    BUP (SUBOXONE)    neg        ------------    TCA    neg        ------------  MDMA    neg        ------------    OXY    neg        ------------    PCP    neg        ------------    BLEACH    neg        ------------      **Procedure Codes**    ---      21308 BRIEF EMOTIONAL/BEHAV ASSMT    ---    65784 DRUG/SUBSTANCE NOS 7/MORE    ---      **Follow Up**    ---    6 Months for ADD f/u, 1 yr for CPE or prn    Electronically signed by [name] , RNCFNP on 06/15/2020 at 04:04 PM EST    Sign off status: Completed        * * [facility]    [address] [place], [place] [address]    Tel: [phone]    Fax: [phone]              * * *          Progress Note: [name] Haggard, NP 05/23/2020    ---    Note generated by eClinicalWorks EMR/PM Software ([email or web address])

## 2020-05-23 NOTE — Progress Notes (Signed)
* * *      [name], [name] **DOB:** May 12, 1976 [phone] yo F) **Acc No.** [id number] **DOS:**  05/23/2020    ---        [name], [name]**    ------    [name], DOB: 1976/03/09    [address] [place], [place] [address]    Home: [phone]    Provider: [name]        * * *    Telephone Encounter    ---    Answered by    Sherle Poe    Date: 05/23/2020        Time: 09:35 AM    Reason    lorazepam    ------            Action Taken                      [name],[name]  05/23/2020 9:35:34 AM > [facility] lor[facility]epam filled 01/26/20 #30      [name] 05/23/2020 2:34:49 PM > Sent.                Refills    Refill Ativan tablet, 0.5 mg, orally, 30, 1 tab(s), HS, prn,  Refills=0    ------          * * *                ---          * * *          Provider: [name] 05/23/2020    ---    Note generated by eClinicalWorks EMR/PM Software ([email or web address])

## 2020-05-24 ENCOUNTER — Ambulatory Visit (HOSPITAL_BASED_OUTPATIENT_CLINIC_OR_DEPARTMENT_OTHER)

## 2020-05-24 NOTE — Progress Notes (Signed)
* * *      [name], [name] **DOB:** June 27, 1976 (44 yo F) **Acc No.** [id number] **DOS:**  05/24/2020    ---        [name], [name]**    ------    12 Y old Female, DOB: [birth year]    [address] [place], [place] [address]    Home: [phone]    Provider: [name]        * * *    Web Encounter    ---    Answered by    Staff, Clinical    Date: 05/24/2020        Time: 03:34 PM    Caller    [name]    ------            Reason    Concerta PA            Message                      Addressed To [name] called me to let me know that the Concerta requires a prior authorization.             Could someone reach out to me when it's all set please?       Thank you.                Action Taken                      Axzel Rockhill B 05/24/2020 6:02:14 PM > Nickie, can you do the PA? TY.      [name],[name]  06/04/2020 12:21:47 PM > faxed from [facility] states no pharmacy coverage       ...      [name],[name]  06/04/2020 12:28:00 PM > spoke to pt who spoke w/her pharmacy and they gave a new number [phone]      [name],[name]  06/05/2020 9:19:40 AM > pa APPROVED for 36 mths pt aware                     * * *        **eMessages**   From:    [name]    ------    Created:    2020-05-24 18:02:12.0    Sent:    2020-05-24 18:03:00.0    Subject:    [id number] PA    Message:    We will, [facility].        [name]                    ---          * * *          Provider: [name] 05/24/2020    ---    Note generated by eClinicalWorks EMR/PM Software ([email or web address])

## 2020-06-08 ENCOUNTER — Ambulatory Visit

## 2020-06-12 ENCOUNTER — Ambulatory Visit (HOSPITAL_BASED_OUTPATIENT_CLINIC_OR_DEPARTMENT_OTHER)

## 2020-06-12 NOTE — Progress Notes (Signed)
* * *    [name], [name] **DOB:** 25-Oct-1975 (44 yo F) **Acc No.** [id number] **DOS:**  06/12/2020    ---        [name], [name]**    ------    77 Y old Female, DOB: 1975/11/04    [address], [place], [place] [address]    Home: [phone]    Provider: [name]        * * *    Web Encounter    ---    Answered by    Staff, Clinical    Date: 06/12/2020        Time: 10:00 AM    Caller    [name]    ------            Reason    TB test            Message                      Addressed To [name],       Could you order a Tb test for be to be done?      I will be shadowing an addictions RN at [facility] and they are requiring me to have this before I go.             They are looking for the IGRa TB blood titre.             If you can't order this, not a problem I'll try and figure it out. The hospital occ health dept will only order it if I've had an exposure which in this client population who knows what I'm in contact with.       Thank you       [name]                                  Action Taken                      fernandes,sahrena  06/12/2020 11:20:22 AM > ok for order?                    * * *        **eMessages**   From:    [name]    ------    Created:    2020-06-12 16:42:10.0    Sent:      Subject:    RE:TB test    Message:    [name],        I sent in the order for the test.        [name]                  * * *        ---        Reason for Appointment    ---      1\. TB test    ---      Assessments    ---    1\. Screening for tuberculosis - Z11.1 (Primary)    ---      Treatment    ---      **1\. Screening for tuberculosis**    _LAB: Quantiferon TB Gold_    ---          * * *  Provider: [name] 06/12/2020    ---    Note generated by eClinicalWorks EMR/PM Software ([email or web address])

## 2020-06-13 ENCOUNTER — Ambulatory Visit

## 2020-06-13 LAB — HX HEMOGLOBIN A1C
CASE NUMBER: 2021342000997
HX EST AVERAGE GLUCOSE (EAG): 103 mg/dL
HX HBF (INTERNAL): 1.1 % — NL
HX HEMOGLOBIN A1C: 5.2 % — NL
HX LA1C (INTERNAL): 2 % — NL
HX P3 PEAK (INTERNAL): 5.1 % — NL
HX TOTAL AREA RANGE (INTERNAL): 129293 microvolt/sec — NL (ref 50000.0–350000.0)

## 2020-06-13 LAB — HX LIPID PANEL
CASE NUMBER: 2021342000997
HX CHOL: 239 mg/dL — ABNORMAL HIGH
HX HDL: 47 mg/dL — NL
HX LDL: 163 mg/dL — ABNORMAL HIGH
HX TRIG: 146 mg/dL — NL

## 2020-06-15 ENCOUNTER — Ambulatory Visit (HOSPITAL_BASED_OUTPATIENT_CLINIC_OR_DEPARTMENT_OTHER)

## 2020-06-15 NOTE — Progress Notes (Signed)
* * *      [name], [name] **DOB:** 11-27-1975 (44 yo F) **Acc No.** [id number] **DOS:**  06/15/2020    ---        [name], [name]**    ------    83 Y old Female, DOB: 04-07-1976    [address] [place], [place] [address]    Home: [phone]    Provider: [name]        * * *    Web Encounter    ---    Answered by    [name]    Date: 06/15/2020        Time: 04:04 PM    Caller    KP    ------            Reason    Results                * * *        **eMessages**   From:    [name]    ------    Created:    2020-06-15 16:05:40.0    Sent:      Subject:    QM:[id number]    Message:    [name],        Your labs are back. A1c is nice and low. Cholesterol is much better than last  time. Take care and have happy and healthy holidays!        [name]                    ---          * * *          Provider: [name] 06/15/2020    ---    Note generated by eClinicalWorks EMR/PM Software ([email or web address])

## 2020-06-16 ENCOUNTER — Ambulatory Visit (HOSPITAL_BASED_OUTPATIENT_CLINIC_OR_DEPARTMENT_OTHER)

## 2020-06-16 LAB — HX QUANTIFERON TB GOLD
CASE NUMBER: 2021342000985
HX QUANTIFERON NIL: 0.06 [IU]/mL
HX QUANTIFERON TB GOLD: NEGATIVE
HX QUANTIFERON TB MITOGEN MINUS NIL: >10
HX QUANTIFERON TB1 MINUS NIL: 0.03 [IU]/mL
HX QUANTIFERON TB2 MINUS NIL: 0 [IU]/mL

## 2020-06-16 NOTE — Progress Notes (Signed)
* * *      [name], [name] **DOB:** Jun 16, 1976 (44 yo F) **Acc No.** [id number] **DOS:**  06/16/2020    ---        [name], [name]**    ------    55 Y old Female, DOB: Sep 12, 1975    [address] [place], [place] [address]    Home: [phone]    Provider: [name]        * * *    Web Encounter    ---    Answered by    [name]    Date: 06/16/2020        Time: 05:53 PM    Caller    KP    ------            Reason    TB test negative                * * *        **eMessages**   From:    [name]    ------    Created:    2020-06-16 17:54:13.0    Sent:      Subject:    RE:TB test negative    Message:    [name],        Your TB test was negative. Stay well and have a healthy 2022!        [name]                    ---          * * *          Provider: [name] 06/16/2020    ---    Note generated by eClinicalWorks EMR/PM Software ([email or web address])

## 2020-07-07 HISTORY — PX: ESOPHAGOGASTRODUODENOSCOPY: SHX1529

## 2020-07-16 ENCOUNTER — Ambulatory Visit (HOSPITAL_BASED_OUTPATIENT_CLINIC_OR_DEPARTMENT_OTHER)

## 2020-07-16 NOTE — Progress Notes (Signed)
* * *      [name], [name] **DOB:** 03/05/1976 (45 yo F) **Acc No.** [id number] **DOS:**  07/16/2020    ---        [name], [name]**    ------    38 Y old Female, DOB: 03-08-1976    [address] [place], [place] [address]    Home: [phone]    Provider: [name]        * * *    Web Encounter    ---    Answered by    Staff, Clinical    Date: 07/16/2020        Time: 07:20 AM    Caller    [name]    ------            Reason    New Refill Request            Message                      Addressed To 38 B       ***Start of Medication List: ***       Concerta      27 mg/24 hr      1 tab(s)      once a day (in the morning)       ***End of Med List ***        Comment :                 Action Taken                      [name], RN,[name] 07/16/2020 7:51:45 AM > per [facility] rx last filled 06/05/2020. cpe 05/23/2020, next appt on 11/19/2020.                Refills    Refill Concerta tablet, extended release, 27 mg/24 hr, orally, 60,  1 tab(s), once a day (in the morning), 60 day(s), Refills=0    ------          * * *        **eMessages**   From:    [name]    ------    Created:    2020-07-16 14:35:08.0    Sent:      Subject:    RE:New Refill Request    Message:    [name],        I sent your RX for Concerta.        [name]                    ---          * * *          Provider: [name] 07/16/2020    ---    Note generated by eClinicalWorks EMR/PM Software ([email or web address])

## 2020-07-17 ENCOUNTER — Ambulatory Visit (HOSPITAL_BASED_OUTPATIENT_CLINIC_OR_DEPARTMENT_OTHER)

## 2020-07-17 NOTE — Progress Notes (Signed)
* * *      [name], [name] **DOB:** February 19, 1976 (45 yo F) **Acc No.** [id number] **DOS:**  07/17/2020    ---        [name], [name]**    ------    11 Y old Female, DOB: [birth year]    [address], [place], [place] [address]    Home: [phone]    Provider: [name]        * * *    Telephone Encounter    ---    Answered by    [name]    Date: 07/17/2020        Time: 08:48 AM    Caller    [facility] left VM    ------            Reason    Concerta script            Message                      message left on Monday afternoon, re. Concerta script, states it was written for a 60-day supply but didn't have a valid ICD-10 diagnosis code on it so they can't fill it, needs to be re-done with a valid ICD-10 code on it.      callback # [phone]                Action Taken                      [name] 07/17/2020 4:48:58 PM > New RX sent with ICD10 code.      [name]  07/17/2020 5:30:19 PM >                 Refills    Refill Concerta tablet, extended release, 27 mg/24 hr, orally, 60,  1 tab(s), once a day (in the morning), 60 day(s), Refills=0    ------          * * *              * * *        ---        Reason for Appointment    ---      1\. Concerta script    ---      Assessments    ---    1\. 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 Attention deficit disorder of adult**    Refill Concerta tablet, extended release, 27 mg/24 hr, 1 tab(s), orally, once  a day (in the morning), 60 day(s), 60, Refills 0, Notes: F90.0 Attention  deficit disorder of adult    ---          * * *          Provider: [name] 07/17/2020    ---    Note generated by eClinicalWorks EMR/PM Software ([email or web address])

## 2020-07-24 ENCOUNTER — Ambulatory Visit (HOSPITAL_BASED_OUTPATIENT_CLINIC_OR_DEPARTMENT_OTHER): Admitting: Family Medicine

## 2020-07-24 ENCOUNTER — Ambulatory Visit: Admitting: Family Medicine

## 2020-07-30 ENCOUNTER — Ambulatory Visit (HOSPITAL_BASED_OUTPATIENT_CLINIC_OR_DEPARTMENT_OTHER): Admitting: Family Medicine

## 2020-07-30 NOTE — Progress Notes (Signed)
* * *      [name], [name] **DOB:** [birth year] (45 yo F) **Acc No.** [id number] **DOS:**  07/30/2020    ---        [name], [name]**    ------    45 Y old Female, DOB: 1976/06/07    [address], [place], [place] [address]    Home: [phone]    Provider: Manus Rudd        * * *    Telephone Encounter    ---    Answered by    Lajean Saver    Date: 07/30/2020        Time: 11:37 AM    Reason    orders needed    ------            Message                      [facility] needs R breast diagnostic mammo and U/S for asymmetry faxed to [phone]                Action Taken                      Trask,Madison  07/30/2020 12:03:15 PM > ordered and faxed                    * * *              * * *        ---        Reason for Appointment    ---      1\. Orders needed    ---      Assessments    ---    1\. 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 Other specified disorders of breast**    _IMAGING: [facility] [facility] Breast Diagnostic Right e-sch*_    _IMAGING: US Breast Limited Right e-sch*_    ---          * * *          Provider: Manus Rudd 07/30/2020    ---    Note generated by eClinicalWorks EMR/PM Software ([email or web address])

## 2020-08-01 ENCOUNTER — Ambulatory Visit: Admitting: Family Medicine

## 2020-08-22 ENCOUNTER — Ambulatory Visit (HOSPITAL_BASED_OUTPATIENT_CLINIC_OR_DEPARTMENT_OTHER)

## 2020-08-22 NOTE — Progress Notes (Signed)
* * *      [name], [name] **DOB:** [birth year] (45 yo F) **Acc No.** [id number] **DOS:**  08/22/2020    ---        [name], [name]**    ------    34 Y old Female, DOB: [birth year]    [address] [place], [place] [address]    Home: [phone]    Provider: [name]        * * *    Web Encounter    ---    Answered by    Staff, Clinical    Date: 08/22/2020        Time: 01:16 PM    Caller    [name]    ------            Reason    New Refill Request            Message                      Addressed To Makenzee Choudhry, [name] B       ***Start of Medication List: ***       cyclobenzaprine      10 mg      1 tab(s)      HS, can increase up to 3 times a day      ,      ibuprofen      800 mg      1 tab(s)      3 times a day       ***End of Med List ***        Comments : Hi [name],      I twisted my back on the ice and exacerbated a flare up could you refill my meds please? If they don't work I'll call and make an appointment with my doctor at [facility].       Thank you.                 Refills    Refill ibuprofen tablet, 800 mg, orally, 90, 1 tab(s), 3 times a  day    ------      Refill cyclobenzaprine tablet, 10 mg, orally, 90, 1 tab(s), TID          * * *        **eMessages**   From:    [name]    ------    Created:    2020-08-22 19:14:44.0    Sent:      Subject:    RE:New Refill Request    Message:    [name]. I sent in the RXes. Hope you feel better soon.        [name]                    ---          * * *          Provider: [name] 08/22/2020    ---    Note generated by eClinicalWorks EMR/PM Software ([email or web address])

## 2020-09-17 ENCOUNTER — Ambulatory Visit (HOSPITAL_BASED_OUTPATIENT_CLINIC_OR_DEPARTMENT_OTHER)

## 2020-09-18 ENCOUNTER — Ambulatory Visit (HOSPITAL_BASED_OUTPATIENT_CLINIC_OR_DEPARTMENT_OTHER)

## 2020-10-01 ENCOUNTER — Ambulatory Visit (HOSPITAL_BASED_OUTPATIENT_CLINIC_OR_DEPARTMENT_OTHER)

## 2020-10-02 ENCOUNTER — Ambulatory Visit (HOSPITAL_BASED_OUTPATIENT_CLINIC_OR_DEPARTMENT_OTHER)

## 2020-10-02 NOTE — Progress Notes (Signed)
* * *        [name]**    --- ---    24 Y old Female, DOB: [birth year], External MRN: [id number]    Account Number: [id number]    [address] [place], [id number]    Home: [phone]    Insurance: HMO [employer]    PCP: Dr. Katheran Awe, MD Referring: Enis Slipper    Appointment Facility: Neurosurgery        * * *    03/04/2018  Progress Notes: Glendell Docker, MD **CHN#:** [id number]    --- ---    ---         **Reason for Appointment**    ---       1\. Low back pain and right leg pain    ---       **History of Present Illness**    ---     _NEUROSURGERY_ :    45 year old female presents in initial neurosurgical consultation with low  back pain and right leg pain. She has worked as a Engineer, civil (consulting) for many years. She  had PT in 2015 without relief. She had MRI in 2016. She saw Dr. Silvana Newness and Dr.  Nona Dell for injections times six without much relief. The pain radiates from the  right buttock to the posterior thigh to the posterior calf. She has tried PT  recently from 05/2017 to 09/2017 without relief.     _Associated Providers_ :    Primary Care Provider Katheran Awe, MD .      **Current Medications**    ---    Taking     * Allegra     ---    * Celexa     ---    * Motrin     ---    * Wellbutrin     ---    * Medication List reviewed and reconciled with the patient    ---       **Past Medical History**    ---       Atypical facial nerve pain.        ---       **Surgical History**    ---       Tonsillectomy    ---      **Social History**    ---    Tobacco  history: Never smoked.      **Allergies**    ---       Codeine Sulfate    ---       **Hospitalization/Major Diagnostic Procedure**    ---       Denies Past Hospitalization    ---       **Review of Systems**    ---     _GENERAL_ :    Negative for: weight loss within the past year,, weight gain within the past  year,, chest pain,, irregular heart beats,, heart murmurs,, nausea /  vomiting,, easy bruising,, fever / chills,, frequent nose bleeds,,  hoarse  voice,, shortness of breath,, breast discharge,, kidney disease,, liver  disease,, previous anesthesiea problems,, excess bleeding during or after  prior procedure,. Positive for: frequent headache,.    _NEUROLOGY_ :    Positive for: back pain, weakness. Negative for: ringing in the ears,, trouble  with balance and coordination,, hearing loss,, vision loss in one eye or the  other,, difficulty swallowing,, double vision,, loss of smell,.          **Vital Signs**    ---  Pain scale 8, Ht-in 62, Wt-lbs 180, BMI 32.92, BP 141/84, RR 16, Pulse sitting  84.       **Physical Examination**    ---     _NEUROSURGERY_ :    MOTOR Lower Extremities : 5/5 strength in the bilateral lower extremities.  SENSATION : Intact to light touch in the bilateral lower extremities. Reflexes  Left Knee Jerk 2+, Right Knee Jerk 2+, Left Ankle Jerk 2+, Right Ankle Jerk  2+.    _DIAGNOSTIC STUDIES_ :    MRI Uploaded to PACS. I reviewed MRI lumbar spine performed on 02/23/18 at  [facility]) which is essentially a normal study without any  significant pathology or right sided nerve compression.          **Assessments**    ---    1\. Sciatica of right side - M54.31 (Primary)    ---      Imp: Right sided sciatica with an umremarkable lumbar MRI and normal  neurologic exam. I cannot demonstrate a spinal origin for this pain and  therefore I'm going to refer her to physiatry to evaluate for non-lumbar  causes of sciatica such as piriformis syndrome, hamstring injury, etc...    ---       **Follow Up**    ---    prn    Electronically signed by Glendell Docker , MD on 03/04/2018 at 05:00 PM EDT    Sign off status: Completed        * * *        Neurosurgery    [address] [place], [address]    [place], [place] [address]    Tel: [phone]    Fax: [phone]              * * *          Patient: [name], [name] DOB: September 12, 1975 Progress Note: Glendell Docker, MD  03/04/2018    ---    Note generated by eClinicalWorks EMR/PM Software  ([email or web address])

## 2020-11-09 ENCOUNTER — Other Ambulatory Visit

## 2020-11-19 ENCOUNTER — Ambulatory Visit (INDEPENDENT_AMBULATORY_CARE_PROVIDER_SITE_OTHER)

## 2020-11-19 NOTE — Progress Notes (Deleted)
[facility]  [address]  [address]  [place] [address]  [phone]    Patient ID: [name] is a 45 y.o. female who presents for ADD follow-up.    Prior discussion at CPE:  Would like to restart her Concerta. Staff has mentioned that she is scattered. She realizes it is true. She signed contract and left urine for UTOX. Checked [place] PAT.    Subjective   HPI    There is no problem list on file for this patient.     No problem-specific Assessment & Plan notes found for this encounter.     Medications:  Concerta tablet extended release 24hr  cyclobenzaprine  ibuprofen  pantoprazole  venlafaxine XR   No past medical history on file.   ROS  Objective   There were no vitals taken for this visit.    Physical Exam    Assessment/Plan   There are no diagnoses linked to this encounter.

## 2020-11-26 ENCOUNTER — Encounter (INDEPENDENT_AMBULATORY_CARE_PROVIDER_SITE_OTHER)

## 2020-11-26 ENCOUNTER — Other Ambulatory Visit (INDEPENDENT_AMBULATORY_CARE_PROVIDER_SITE_OTHER)

## 2020-11-28 ENCOUNTER — Encounter (INDEPENDENT_AMBULATORY_CARE_PROVIDER_SITE_OTHER)

## 2020-11-28 ENCOUNTER — Other Ambulatory Visit

## 2020-11-28 ENCOUNTER — Ambulatory Visit

## 2020-11-28 ENCOUNTER — Encounter

## 2020-11-28 DIAGNOSIS — F988 Other specified behavioral and emotional disorders with onset usually occurring in childhood and adolescence: Secondary | ICD-10-CM

## 2020-11-28 MED ORDER — methylphenidate (Concerta) 36 mg ER tablet
36 | ORAL_TABLET | Freq: Every morning | ORAL | 0 refills | Status: DC
Start: 2020-11-28 — End: 2021-01-17
  Filled 2020-11-28: qty 60, 60d supply, fill #0

## 2020-11-28 MED ORDER — venlafaxine XR (Effexor-XR) 150 mg 24 hr capsule
150 | ORAL_CAPSULE | Freq: Every day | ORAL | 3 refills | Status: AC
Start: 2020-11-28 — End: 2022-01-17
  Filled 2021-01-16: qty 90, 90d supply, fill #0

## 2020-11-28 NOTE — Progress Notes (Signed)
[facility]  [address]  [address]  [place] [address]  [phone]    Patient ID: [name] is a 45 y.o. female who presents for No chief complaint on file.    Subjective   This real-time, interactive virtual Telehealth encounter was done by video with the patient's verbal consent. Two patient identifiers were used and confirmed. Physical location of the patient: work Patient resides in: [place]  Physical location of the provider: office. Other participants/involvement: none  Total minutes spent: 20    She needs a refill on her Concerta. She feels that it dies down at 2 pm. She takes it daily but not on weekends. She finds it is really helping. She is also going to therapy and making "big strides." She hasn't had to use ativan in awhile. Did start using THC drops which has been helpful for her back and anxiety, uses now and then before bed.     She also requests changing her venlafaxine to tablets as the capsules give her heartburn. The days she doesn't take it she doesn't have heartburn. Would like to switch to tablets.       Patient Active Problem List   Diagnosis   ? Acne   ? Anxiety   ? Attention deficit disorder of adult   ? Chronic low back pain   ? Allergic rhinitis   ? Depression with anxiety   ? Exercise-induced asthma   ? GERD (gastroesophageal reflux disease)   ? Migraine headache   ? BMI 30.0-30.9,adult   ? Obesity (BMI 30.0-34.9)   ? Piriformis syndrome of right side      No problem-specific Assessment & Plan notes found for this encounter.       Medications:  cyclobenzaprine  fexofenadine  ibuprofen  methylphenidate  pantoprazole  venlafaxine     Past Medical History:   Diagnosis Date   ? Anemia     with pregnancy   ? Anxiety    ? Asthma    ? Migraines       ROS  Objective   There were no vitals taken for this visit.    Physical Exam  Constitutional:       Appearance: Normal appearance.   Neurological:      Mental Status: She is alert.   Psychiatric:         Mood and  Affect: Mood normal.         Behavior: Behavior normal.         Thought Content: Thought content normal.         Judgment: Judgment normal.         Assessment/Plan   Attention deficit disorder of adult  -     methylphenidate (Concerta) 36 mg ER tablet; Take 1 tablet (36 mg) by mouth in the morning. Do not crush, chew, or split.  Anxiety  -     venlafaxine 150 mg 24 hr tablet; Take 1 tablet (150 mg) by mouth in the morning. Do not crush, chew, or split.    Will try a higher dose of Concerta. Other option is to add an immediate release afternoon dose if this isn't effective. I also changed her venlafaxine to the tablet to see if it makes a difference in her heartburn. She is doing well with therapy. Will f/u at her CPE in August, sooner prn.

## 2020-12-04 ENCOUNTER — Other Ambulatory Visit

## 2020-12-04 MED FILL — PANTOPRAZOLE 40 MG TABLET,DELAYED RELEASE: 40 40 mg | ORAL | 30 days supply | Qty: 30 | Fill #0 | Status: CP

## 2020-12-05 ENCOUNTER — Other Ambulatory Visit

## 2020-12-31 ENCOUNTER — Other Ambulatory Visit (INDEPENDENT_AMBULATORY_CARE_PROVIDER_SITE_OTHER): Admitting: Family Medicine

## 2021-01-01 ENCOUNTER — Other Ambulatory Visit

## 2021-01-01 MED FILL — PANTOPRAZOLE 40 MG TABLET,DELAYED RELEASE: 40 40 mg | ORAL | 90 days supply | Qty: 90 | Fill #0 | Status: CP

## 2021-01-11 ENCOUNTER — Other Ambulatory Visit (HOSPITAL_BASED_OUTPATIENT_CLINIC_OR_DEPARTMENT_OTHER): Admitting: Internal Medicine

## 2021-01-16 ENCOUNTER — Other Ambulatory Visit

## 2021-01-16 ENCOUNTER — Encounter (INDEPENDENT_AMBULATORY_CARE_PROVIDER_SITE_OTHER)

## 2021-01-17 ENCOUNTER — Other Ambulatory Visit

## 2021-01-17 MED ORDER — azelastine (Astelin) 137 mcg (0.1 %) nasal spray
137 | Freq: Two times a day (BID) | NASAL | 3 refills | Status: DC
Start: 2021-01-17 — End: 2021-09-03
  Filled 2021-01-17: qty 30, 25d supply, fill #0

## 2021-01-18 ENCOUNTER — Other Ambulatory Visit

## 2021-01-22 ENCOUNTER — Other Ambulatory Visit

## 2021-01-22 MED ORDER — gabapentin (Neurontin) 100 mg capsule
100 | ORAL_CAPSULE | ORAL | 2 refills | Status: DC
Start: 2021-01-22 — End: 2021-04-15
  Filled 2021-01-22: qty 30, 30d supply, fill #0

## 2021-01-22 MED ORDER — sucralfate (Carafate) 1 gram tablet
1 | ORAL_TABLET | ORAL | 0 refills | 28.00000 days | Status: DC
Start: 2021-01-22 — End: 2021-04-15
  Filled 2021-01-22: qty 120, 30d supply, fill #0

## 2021-01-23 ENCOUNTER — Other Ambulatory Visit (HOSPITAL_BASED_OUTPATIENT_CLINIC_OR_DEPARTMENT_OTHER): Admitting: Internal Medicine

## 2021-01-24 ENCOUNTER — Encounter (HOSPITAL_BASED_OUTPATIENT_CLINIC_OR_DEPARTMENT_OTHER)

## 2021-01-25 ENCOUNTER — Other Ambulatory Visit

## 2021-01-25 MED ORDER — methylphenidate (Concerta) 36 mg ER tablet
36 | ORAL_TABLET | Freq: Every morning | ORAL | 0 refills | Status: DC
Start: 2021-01-25 — End: 2021-03-24
  Filled 2021-01-25: qty 60, 60d supply, fill #0

## 2021-01-25 NOTE — Telephone Encounter (Addendum)
 I looks like this was forwarded to me but I did not see it until today.  Checked [facility], last filled 11/28/20.   Forwarding to PCP as Mariah Shines is out of the office until 01/30/21.

## 2021-01-28 ENCOUNTER — Other Ambulatory Visit

## 2021-02-01 ENCOUNTER — Encounter (HOSPITAL_BASED_OUTPATIENT_CLINIC_OR_DEPARTMENT_OTHER)

## 2021-02-04 ENCOUNTER — Encounter (HOSPITAL_BASED_OUTPATIENT_CLINIC_OR_DEPARTMENT_OTHER)

## 2021-02-04 ENCOUNTER — Other Ambulatory Visit

## 2021-02-04 ENCOUNTER — Ambulatory Visit

## 2021-02-04 ENCOUNTER — Other Ambulatory Visit (INDEPENDENT_AMBULATORY_CARE_PROVIDER_SITE_OTHER): Admitting: Family Medicine

## 2021-02-04 ENCOUNTER — Encounter

## 2021-02-04 MED ORDER — gabapentin (Neurontin) 300 mg capsule
300 | ORAL_CAPSULE | ORAL | 0 refills | Status: DC
Start: 2021-02-04 — End: 2021-04-17
  Filled 2021-02-04: qty 90, 90d supply, fill #0

## 2021-02-05 ENCOUNTER — Other Ambulatory Visit

## 2021-02-19 ENCOUNTER — Encounter (HOSPITAL_BASED_OUTPATIENT_CLINIC_OR_DEPARTMENT_OTHER)

## 2021-02-19 ENCOUNTER — Other Ambulatory Visit

## 2021-02-25 ENCOUNTER — Other Ambulatory Visit

## 2021-03-01 ENCOUNTER — Encounter (HOSPITAL_BASED_OUTPATIENT_CLINIC_OR_DEPARTMENT_OTHER)

## 2021-03-18 ENCOUNTER — Encounter

## 2021-03-18 ENCOUNTER — Ambulatory Visit (INDEPENDENT_AMBULATORY_CARE_PROVIDER_SITE_OTHER)

## 2021-03-21 ENCOUNTER — Encounter

## 2021-03-21 ENCOUNTER — Ambulatory Visit (INDEPENDENT_AMBULATORY_CARE_PROVIDER_SITE_OTHER)

## 2021-03-24 ENCOUNTER — Other Ambulatory Visit (INDEPENDENT_AMBULATORY_CARE_PROVIDER_SITE_OTHER): Admitting: Family Medicine

## 2021-03-24 ENCOUNTER — Other Ambulatory Visit (INDEPENDENT_AMBULATORY_CARE_PROVIDER_SITE_OTHER)

## 2021-03-24 ENCOUNTER — Other Ambulatory Visit

## 2021-03-25 ENCOUNTER — Other Ambulatory Visit

## 2021-03-25 MED ORDER — methylphenidate (Concerta) 36 mg ER tablet
36 | ORAL_TABLET | Freq: Every morning | ORAL | 0 refills | Status: DC
Start: 2021-03-25 — End: 2021-06-07
  Filled 2021-03-25: qty 60, 60d supply, fill #0

## 2021-03-25 MED FILL — PANTOPRAZOLE 40 MG TABLET,DELAYED RELEASE: 40 40 mg | ORAL | 90 days supply | Qty: 90 | Fill #1 | Status: CP

## 2021-03-25 NOTE — Telephone Encounter (Signed)
 Pt cancelled appt 9/12, no-show 9/15, has CPE appt pending for 05/27/21

## 2021-03-25 NOTE — Telephone Encounter (Signed)
 Last PE 05/23/20. Has appt scheduled 05/27/21. [facility] last filled Concerta 01/25/21.

## 2021-03-26 ENCOUNTER — Other Ambulatory Visit

## 2021-03-27 ENCOUNTER — Other Ambulatory Visit

## 2021-03-27 MED ORDER — ibuprofen 800 mg tablet
800 | ORAL_TABLET | ORAL | 0 refills | Status: DC
Start: 2021-03-27 — End: 2021-06-07
  Filled 2021-03-27: qty 90, 30d supply, fill #0

## 2021-04-15 ENCOUNTER — Other Ambulatory Visit

## 2021-04-15 ENCOUNTER — Ambulatory Visit (INDEPENDENT_AMBULATORY_CARE_PROVIDER_SITE_OTHER)

## 2021-04-15 ENCOUNTER — Encounter (INDEPENDENT_AMBULATORY_CARE_PROVIDER_SITE_OTHER)

## 2021-04-15 ENCOUNTER — Encounter

## 2021-04-15 VITALS — BP 130/80 | HR 99 | Wt 179.0 lb

## 2021-04-15 DIAGNOSIS — F418 Other specified anxiety disorders: Secondary | ICD-10-CM

## 2021-04-15 MED ORDER — venlafaxine XR (Effexor-XR) 75 mg 24 hr capsule
75 | ORAL_CAPSULE | Freq: Every day | ORAL | 1 refills | Status: DC
Start: 2021-04-15 — End: 2021-09-02
  Filled 2021-04-15: qty 30, 30d supply, fill #0

## 2021-04-15 NOTE — Progress Notes (Signed)
 [facility]  [address]  [address]  [place] [address]  [phone]    Patient ID: [name] is a 45 y.o. female who presents for medication follow-up. She has a physical scheduled for November.    Subjective   Her anxiety is really high. She went on medical leave 9/26, her therapist completed the paperwork. Has had a lot of stresses at work. She feels she needs to go up on her Effexor. Having trouble making decisions. Working through the issues with her therapist.     GI put her on gabapentin after her endoscopy was negative, and it has been a Secretary/administrator. Has minimal heartburn. She will call them for a refill.       Prior note.   Assessment/Plan   Attention deficit disorder of adult  -     methylphenidate (Concerta) 36 mg ER tablet; Take 1 tablet (36 mg) by mouth in the morning. Do not crush, chew, or split.  Anxiety  -     venlafaxine 150 mg 24 hr tablet; Take 1 tablet (150 mg) by mouth in the morning. Do not crush, chew, or split.    Will try a higher dose of Concerta. Other option is to add an immediate release afternoon dose if this isn't effective. I also changed her venlafaxine to the tablet to see if it makes a difference in her heartburn. She is doing well with therapy. Will f/u at her CPE in August, sooner prn.     Medications:  albuterol  azelastine  butalbital-acetaminophen-caff  cyclobenzaprine  fexofenadine  gabapentin  ibuprofen  LORazepam  methylphenidate  pantoprazole  venlafaxine XR     Past Medical History:   Diagnosis Date   . Anemia     with pregnancy   . Anxiety    . Asthma    . Migraines       ROS  Objective   Visit Vitals  BP 130/80   Pulse 99   Wt 81.2 kg Comment: 179.0 lbs   SpO2 97%   BMI 32.74 kg/m   BSA 1.88 m       Physical Exam  Constitutional:       Appearance: Normal appearance.   HENT:      Mouth/Throat:      Mouth: Mucous membranes are moist.      Pharynx: Oropharynx is clear.   Neck:      Thyroid: No thyroid mass or thyromegaly.   Cardiovascular:      Rate  and Rhythm: Normal rate and regular rhythm.      Heart sounds: Normal heart sounds.   Pulmonary:      Effort: Pulmonary effort is normal.      Breath sounds: Normal breath sounds.   Musculoskeletal:      Cervical back: Neck supple.   Lymphadenopathy:      Cervical: No cervical adenopathy.   Skin:     General: Skin is warm and dry.   Neurological:      General: No focal deficit present.      Mental Status: She is alert.   Psychiatric:         Thought Content: Thought content normal.      Comments: Sad, tearful, anxious         Assessment/Plan   [name] was seen today for med follow-up.  Depression with anxiety  -     venlafaxine XR (Effexor-XR) 75 mg 24 hr capsule; Take 1 capsule (75 mg) by mouth  in the morning. Take with food.  Need for vaccination  -     Flu vaccine (IIV4) greater than or equal to 6 months old, preservative free  PPD screening test  -     TB Skin Test    She is having a lot of stress on the job. Had to take FMLA. Working with her therapist. Discussed that maximum dose of Effexor is 225 mg. Will add in 75 mg dose to take with her 150 mg dose. When things settle down she can lower dose to 150 mg. Counseling and psychosocial support given. She will continue seeing her therapist weekly. She has CPE scheduled next month and we will follow-up then, sooner prn.    Total visit time 35 min. which includes visit preparation, review of consult notes, face to face time with patient, ordering/reviewing labs/imaging/meds and documentation of visit.

## 2021-04-16 ENCOUNTER — Other Ambulatory Visit

## 2021-04-16 ENCOUNTER — Encounter (INDEPENDENT_AMBULATORY_CARE_PROVIDER_SITE_OTHER): Admitting: Family Medicine

## 2021-04-17 ENCOUNTER — Other Ambulatory Visit

## 2021-04-17 MED ORDER — gabapentin (Neurontin) 300 mg capsule
300 | ORAL_CAPSULE | ORAL | 0 refills | Status: DC
Start: 2021-04-17 — End: 2021-09-03
  Filled 2021-04-17: qty 90, 90d supply, fill #0

## 2021-04-18 ENCOUNTER — Other Ambulatory Visit

## 2021-04-19 ENCOUNTER — Other Ambulatory Visit

## 2021-04-19 MED FILL — VENLAFAXINE ER 150 MG CAPSULE,EXTENDED RELEASE 24 HR: 150 150 mg | ORAL | 90 days supply | Qty: 90 | Fill #1 | Status: CP

## 2021-04-22 ENCOUNTER — Other Ambulatory Visit

## 2021-04-23 ENCOUNTER — Encounter (INDEPENDENT_AMBULATORY_CARE_PROVIDER_SITE_OTHER)

## 2021-04-26 ENCOUNTER — Other Ambulatory Visit

## 2021-05-01 ENCOUNTER — Encounter

## 2021-05-01 ENCOUNTER — Ambulatory Visit (HOSPITAL_BASED_OUTPATIENT_CLINIC_OR_DEPARTMENT_OTHER)

## 2021-05-03 ENCOUNTER — Other Ambulatory Visit (HOSPITAL_BASED_OUTPATIENT_CLINIC_OR_DEPARTMENT_OTHER): Admitting: Family Medicine

## 2021-05-03 ENCOUNTER — Encounter (HOSPITAL_BASED_OUTPATIENT_CLINIC_OR_DEPARTMENT_OTHER)

## 2021-05-07 ENCOUNTER — Other Ambulatory Visit

## 2021-05-23 ENCOUNTER — Ambulatory Visit (INDEPENDENT_AMBULATORY_CARE_PROVIDER_SITE_OTHER): Admitting: Orthopaedic Surgery

## 2021-05-24 ENCOUNTER — Encounter (INDEPENDENT_AMBULATORY_CARE_PROVIDER_SITE_OTHER): Admitting: Family Medicine

## 2021-05-24 ENCOUNTER — Encounter

## 2021-05-27 ENCOUNTER — Encounter (INDEPENDENT_AMBULATORY_CARE_PROVIDER_SITE_OTHER)

## 2021-05-27 ENCOUNTER — Ambulatory Visit: Payer: BLUE CROSS/BLUE SHIELD | Primary: Family Medicine

## 2021-06-07 ENCOUNTER — Other Ambulatory Visit

## 2021-06-07 ENCOUNTER — Other Ambulatory Visit (INDEPENDENT_AMBULATORY_CARE_PROVIDER_SITE_OTHER)

## 2021-06-07 ENCOUNTER — Other Ambulatory Visit (INDEPENDENT_AMBULATORY_CARE_PROVIDER_SITE_OTHER): Admitting: Family Medicine

## 2021-06-07 MED ORDER — methylphenidate (Concerta) 36 mg ER tablet
36 | ORAL_TABLET | Freq: Every morning | ORAL | 0 refills | Status: DC
Start: 2021-06-07 — End: 2021-08-19
  Filled 2021-06-07: qty 60, 60d supply, fill #0

## 2021-06-07 MED ORDER — ibuprofen 800 mg tablet
800 | ORAL_TABLET | ORAL | 0 refills | Status: DC
Start: 2021-06-07 — End: 2021-08-19
  Filled 2021-06-07: qty 90, 30d supply, fill #0

## 2021-06-10 ENCOUNTER — Other Ambulatory Visit

## 2021-06-19 ENCOUNTER — Other Ambulatory Visit

## 2021-06-26 ENCOUNTER — Encounter

## 2021-06-26 ENCOUNTER — Encounter (INDEPENDENT_AMBULATORY_CARE_PROVIDER_SITE_OTHER)

## 2021-06-26 NOTE — Progress Notes (Deleted)
 [facility]  [address]  [address]  [place] [address]  [phone]    Patient ID: [name] is a 45 y.o. female who presents for her annual exam    Concerns today:   . No chief complaint on file.  Marland Kitchen      Recent Consults and Problems:   . Office visit in November: She is having a lot of stress on the job. Had to take FMLA. Working with her therapist. Discussed that maximum dose of Effexor is 225 mg. Will add in 75 mg dose to take with her 150 mg dose. When things settle down she can lower dose to 150 mg. Counseling and psychosocial support given. She will continue seeing her therapist weekly. She has CPE scheduled next month and we will follow-up then, sooner prn.    Screening Tests:   . PAP: 04/2018 NILM, negative HPV, due 2024  . Mammogram: 07/2020 w/addl right views, Birads 1  . Colonoscopy: average risk, start age 66  . Bone Density: average risk, start age 53  . LDCT: N/A, never smoker    Immunizations:   . COvid Pfizer x 4 including bivalent  . Hep B x 3  . Flu 04/15/2021  . Tdap 2018    ROS:   . Skin care:   Marland Kitchen Vision:   . Dental care:   Marland Kitchen Periods:   . Sexual activity:  . Contraception:   . All other systems negative except as documented above.     Social History:  reports that she has never smoked. She has never used smokeless tobacco. She reports current alcohol use. She reports that she does not use drugs.     . Diet:   . Exercise:   . Alcohol:  . Drugs:  . Tobacco:   . Occupation:  . Household/Family:      Family History: family history includes Anxiety disorder in her father; Basal cell carcinoma in her father; Cardiomyopathy in her father; Depression in her father; Diabetes in her mother; Drug abuse in her brother; Heart attack in her maternal grandmother; Heart disease in her father; Heart failure in her mother; Hypertension in her mother; Hypothyroidism in her mother; orthostatic tremor in her mother; renal cell cancer in her mother.     . Recent changes in Family History:      Medications:    Current Outpatient Medications   Medication Instructions   . albuterol 90 mcg/actuation inhaler inhalation   . azelastine (Astelin) 137 mcg (0.1 %) nasal spray INSTILL 2 SPRAYS INTO EACH NOSTRIL EVERY MORNING AND AT BEDTIME   . butalbital-acetaminophen-caff 50-325-40 mg tablet butalbital-acetaminophen-caffeine 50 mg-325 mg-40 mg tablet   . Concerta 36 mg, oral, Every morning, Do not crush, chew, or split.    . cyclobenzaprine (Flexeril) 10 mg tablet TAKE 1 TABLET BY MOUTH 3 TIMES A DAY   . fexofenadine (ALLEGRA) 180 mg, oral, Daily   . gabapentin (Neurontin) 300 mg capsule TAKE 1 CAPSULE BY MOUTH EVERY DAY   . ibuprofen 800 mg tablet TAKE 1 TABLET BY MOUTH 3 TIMES A DAY   . LORazepam (ATIVAN) 0.5 mg, oral, Daily PRN   . pantoprazole (ProtoNix) 40 mg EC tablet TAKE 1 TABLET BY MOUTH EVERY DAY   . venlafaxine XR (EFFEXOR-XR) 150 mg, oral, Daily   . venlafaxine XR (EFFEXOR-XR) 75 mg, oral, Daily, Take with food.         Past Medical History:   Diagnosis Date   . Anemia  with pregnancy   . Anxiety    . Asthma    . Migraines             Objective   There were no vitals taken for this visit.    Physical Exam    Assessment/Plan   There are no diagnoses linked to this encounter.

## 2021-07-03 ENCOUNTER — Encounter

## 2021-07-03 ENCOUNTER — Ambulatory Visit (INDEPENDENT_AMBULATORY_CARE_PROVIDER_SITE_OTHER): Admitting: Family Medicine

## 2021-07-04 ENCOUNTER — Other Ambulatory Visit

## 2021-07-04 ENCOUNTER — Encounter (INDEPENDENT_AMBULATORY_CARE_PROVIDER_SITE_OTHER): Admitting: Family Medicine

## 2021-07-04 ENCOUNTER — Encounter

## 2021-07-04 ENCOUNTER — Telehealth (INDEPENDENT_AMBULATORY_CARE_PROVIDER_SITE_OTHER): Admitting: Family Medicine

## 2021-07-04 DIAGNOSIS — R21 Rash and other nonspecific skin eruption: Secondary | ICD-10-CM

## 2021-07-04 MED ORDER — triamcinolone (Kenalog) 0.1 % cream
0.1 | TOPICAL | 5 refills | Status: DC
Start: 2021-07-04 — End: 2021-09-03
  Filled 2021-07-04: qty 30, 30d supply, fill #0

## 2021-07-04 NOTE — Progress Notes (Signed)
 [facility]  [facility]  [address]  [address]  [place] [address]  Dept: [phone]  Dept Fax: [phone]     Patient ID: [name] is a 45 y.o. female who presents for No chief complaint on file..    Subjective   Telemedicine was conducted to a 45 year old female. She reports of rash  rash  Behind of both  ears. It is itchy. It began 5 days ago. She is concerned as her niece was diagnosed with bath tab folliculitis. She denies fever. No other symptoms.      Review of Systems   Constitutional: Negative.    Skin: Positive for rash.   All other systems reviewed and are negative.    Patient Active Problem List   Diagnosis   . Acne   . Anxiety   . Attention deficit disorder of adult   . Chronic low back pain   . Allergic rhinitis   . Depression with anxiety   . Exercise-induced asthma   . GERD (gastroesophageal reflux disease)   . Migraine headache   . BMI 30.0-30.9,adult   . Obesity (BMI 30.0-34.9)   . Piriformis syndrome of right side   . Routine health maintenance   . Rash     Current Outpatient Medications   Medication Instructions   . albuterol 90 mcg/actuation inhaler inhalation   . azelastine (Astelin) 137 mcg (0.1 %) nasal spray INSTILL 2 SPRAYS INTO EACH NOSTRIL EVERY MORNING AND AT BEDTIME   . butalbital-acetaminophen-caff 50-325-40 mg tablet butalbital-acetaminophen-caffeine 50 mg-325 mg-40 mg tablet   . Concerta 36 mg, oral, Every morning, Do not crush, chew, or split.    . cyclobenzaprine (Flexeril) 10 mg tablet TAKE 1 TABLET BY MOUTH 3 TIMES A DAY   . fexofenadine (ALLEGRA) 180 mg, oral, Daily   . gabapentin (Neurontin) 300 mg capsule TAKE 1 CAPSULE BY MOUTH EVERY DAY   . ibuprofen 800 mg tablet TAKE 1 TABLET BY MOUTH 3 TIMES A DAY   . LORazepam (ATIVAN) 0.5 mg, oral, Daily PRN   . pantoprazole (ProtoNix) 40 mg EC tablet TAKE 1 TABLET BY MOUTH EVERY DAY   . triamcinolone (Kenalog) 0.1 % cream Apply to affected area 1 to 2 times daily as needed. Avoid face and groin.   Marland Kitchen  venlafaxine XR (EFFEXOR-XR) 150 mg, oral, Daily   . venlafaxine XR (EFFEXOR-XR) 75 mg, oral, Daily, Take with food.     Allergies   Allergen Reactions   . Codeine      Other reaction(s): Vomiting  sensitivity       Social History     Tobacco Use   . Smoking status: Never   . Smokeless tobacco: Never   Vaping Use   . Vaping Use: Never used   Substance Use Topics   . Alcohol use: Yes     Comment: socially   . Drug use: Never     Comment: CBD oil on back       Objective   There were no vitals taken for this visit.    Physical Exam    Assessment/Plan   Rash  Assessment & Plan:  I suspect eczema vs psoriasis. Through video I saw redness postauricular area. Will try topical steroid to apply thinly in affected areas. If not better in 2 weeks or get worse I advised to book appt for physical evaluation.   Orders:  -     triamcinolone (Kenalog) 0.1 % cream; Apply to affected  area 1 to 2 times daily as needed. Avoid face and groin.        This real-time, interactive virtual Telehealth encounter was done by video with the patient's verbal consent. Two patient identifiers were used and confirmed. Physical location of the patient: home.(atwork) Patient resides in: [place]  Physical location of the provider: office. Other participants/involvement: none  Total minutes spent: 14

## 2021-07-04 NOTE — Assessment & Plan Note (Addendum)
I suspect eczema vs psoriasis. Through video I saw redness postauricular area. Will try topical steroid to apply thinly in affected areas. If not better in 2 weeks or get worse I advised to book appt for physical evaluation.

## 2021-07-16 ENCOUNTER — Other Ambulatory Visit

## 2021-07-16 MED FILL — VENLAFAXINE ER 150 MG CAPSULE,EXTENDED RELEASE 24 HR: 150 150 mg | ORAL | 90 days supply | Qty: 90 | Fill #2 | Status: CP

## 2021-07-17 ENCOUNTER — Other Ambulatory Visit

## 2021-07-29 ENCOUNTER — Other Ambulatory Visit (INDEPENDENT_AMBULATORY_CARE_PROVIDER_SITE_OTHER)

## 2021-07-30 ENCOUNTER — Other Ambulatory Visit

## 2021-07-30 ENCOUNTER — Ambulatory Visit: Payer: BLUE CROSS/BLUE SHIELD | Primary: Family Medicine

## 2021-07-30 DIAGNOSIS — Z1231 Encounter for screening mammogram for malignant neoplasm of breast: Secondary | ICD-10-CM

## 2021-07-30 MED ORDER — pantoprazole (ProtoNix) 40 mg EC tablet
40 | ORAL_TABLET | Freq: Every day | ORAL | 3 refills | Status: AC
Start: 2021-07-30 — End: 2022-07-30
  Filled 2021-07-30: qty 90, 90d supply, fill #0

## 2021-07-30 NOTE — Telephone Encounter (Signed)
Called pt at home number listed, mailbox full, could not leave a message.  Called mobile number listed, spoke to pt.  CPE apt scheduled for 2/9 at 11:15am.  She doesn't need the Concerta yet, but does need the pantoprazole, thinks that the [place] app put in the others automatically, but she definitely needs the pantoprazole.

## 2021-07-30 NOTE — Telephone Encounter (Signed)
Checked [facility], the Concerta was last written and filled 06/07/21 for a 60-day supply, was sent to [facility] so that one is too soon.  Last f/u appt 04/15/21, last CPE 05/23/2020.

## 2021-07-31 ENCOUNTER — Encounter (HOSPITAL_BASED_OUTPATIENT_CLINIC_OR_DEPARTMENT_OTHER)

## 2021-07-31 ENCOUNTER — Other Ambulatory Visit (HOSPITAL_BASED_OUTPATIENT_CLINIC_OR_DEPARTMENT_OTHER): Admitting: Family Medicine

## 2021-07-31 ENCOUNTER — Other Ambulatory Visit

## 2021-07-31 MED ORDER — gabapentin (Neurontin) 300 mg capsule
300 | ORAL_CAPSULE | ORAL | 1 refills | Status: DC
Start: 2021-07-31 — End: 2021-11-11
  Filled 2021-07-31: qty 90, 90d supply, fill #0

## 2021-07-31 MED ORDER — phentermine (Lomaira) 8 mg tablet
8 | ORAL_TABLET | ORAL | 2 refills | Status: DC
Start: 2021-07-31 — End: 2021-09-02
  Filled 2021-08-13: qty 30, 30d supply, fill #0

## 2021-08-01 ENCOUNTER — Other Ambulatory Visit

## 2021-08-01 ENCOUNTER — Other Ambulatory Visit (HOSPITAL_BASED_OUTPATIENT_CLINIC_OR_DEPARTMENT_OTHER)

## 2021-08-01 ENCOUNTER — Encounter (HOSPITAL_BASED_OUTPATIENT_CLINIC_OR_DEPARTMENT_OTHER)

## 2021-08-01 ENCOUNTER — Ambulatory Visit
Admission: RE | Admit: 2021-08-01 | Discharge: 2021-08-01 | Disposition: A | Discharge status: Home / Self Care | Source: Ambulatory Visit

## 2021-08-01 DIAGNOSIS — M9903 Segmental and somatic dysfunction of lumbar region: Secondary | ICD-10-CM

## 2021-08-06 ENCOUNTER — Other Ambulatory Visit

## 2021-08-07 ENCOUNTER — Other Ambulatory Visit

## 2021-08-12 ENCOUNTER — Other Ambulatory Visit

## 2021-08-14 ENCOUNTER — Other Ambulatory Visit

## 2021-08-14 ENCOUNTER — Other Ambulatory Visit (HOSPITAL_BASED_OUTPATIENT_CLINIC_OR_DEPARTMENT_OTHER): Admitting: Internal Medicine

## 2021-08-15 ENCOUNTER — Encounter (INDEPENDENT_AMBULATORY_CARE_PROVIDER_SITE_OTHER)

## 2021-08-15 NOTE — Progress Notes (Deleted)
 [facility]  [address]  [address]  [place] [address]  [phone]    Patient ID: [name] is a 46 y.o. female who presents for her annual exam    Concerns today:   . No chief complaint on file.  Marland Kitchen     Recent Consults and Problems:    Office visit in November: She is having a lot of stress on the job. Had to take FMLA. Working with her therapist. Discussed that maximum dose of Effexor is 225 mg. Will add in 75 mg dose to take with her 150 mg dose. When things settle down she can lower dose to 150 mg. Counseling and psychosocial support given. She will continue seeing her therapist weekly. She has CPE scheduled next month and we will follow-up then, sooner prn.     Screening Tests:  . PAP: 04/2018 NILM, negative HPV, due 2024  . Mammogram: 07/2020 w/addl right views, Birads 1  . Colonoscopy: average risk, start age 5  . Bone Density: average risk, start age 53  . LDCT: N/A, never smoker    Immunizations:    Research officer, political party x 4 including bivalent   Hep B x 3   Flu 04/15/2021   Tdap 2018    ROS:   . Skin care:   Marland Kitchen Vision:   . Dental care:   Marland Kitchen Periods:   . Sexual activity:  . Contraception:   . All other systems negative except as documented above.     Social History:  reports that she has never smoked. She has never used smokeless tobacco. She reports current alcohol use. She reports that she does not use drugs.     . Diet:   . Exercise:   . Alcohol:  . Drugs:  . Tobacco:   . Occupation:  . Household/Family:      Family History: family history includes Anxiety disorder in her father; Basal cell carcinoma in her father; Cardiomyopathy in her father; Depression in her father; Diabetes in her mother; Drug abuse in her brother; Heart attack in her maternal grandmother; Heart disease in her father; Heart failure in her mother; Hypertension in her mother; Hypothyroidism in her mother; orthostatic tremor in her mother; renal cell cancer in her mother.     . Recent changes in Family History:      Medications:    Current Outpatient Medications   Medication Instructions   . albuterol 90 mcg/actuation inhaler inhalation   . azelastine (Astelin) 137 mcg (0.1 %) nasal spray INSTILL 2 SPRAYS INTO EACH NOSTRIL EVERY MORNING AND AT BEDTIME   . butalbital-acetaminophen-caff 50-325-40 mg tablet butalbital-acetaminophen-caffeine 50 mg-325 mg-40 mg tablet   . Concerta 36 mg, oral, Every morning, Do not crush, chew, or split.    . cyclobenzaprine (Flexeril) 10 mg tablet TAKE 1 TABLET BY MOUTH 3 TIMES A DAY   . fexofenadine (ALLEGRA) 180 mg, oral, Daily   . gabapentin (Neurontin) 300 mg capsule TAKE 1 CAPSULE BY MOUTH EVERY DAY   . gabapentin (Neurontin) 300 mg capsule TAKE 1 CAPSULE BY MOUTH EVERY DAY   . ibuprofen 800 mg tablet TAKE 1 TABLET BY MOUTH 3 TIMES A DAY   . LORazepam (ATIVAN) 0.5 mg, oral, Daily PRN   . pantoprazole (ProtoNix) 40 mg EC tablet TAKE 1 TABLET BY MOUTH EVERY DAY   . phentermine (Lomaira) 8 mg tablet TAKE 1 TABLET BY MOUTH EVERY DAY   . triamcinolone (Kenalog) 0.1 % cream Apply to affected area 1 to  2 times daily as needed. Avoid face and groin.   Marland Kitchen venlafaxine XR (EFFEXOR-XR) 150 mg, oral, Daily   . venlafaxine XR (EFFEXOR-XR) 75 mg, oral, Daily, Take with food.         Past Medical History:   Diagnosis Date   . Anemia     with pregnancy   . Anxiety    . Asthma    . Migraines             Objective   There were no vitals taken for this visit.    Physical Exam    Assessment/Plan   There are no diagnoses linked to this encounter.

## 2021-08-19 ENCOUNTER — Other Ambulatory Visit (INDEPENDENT_AMBULATORY_CARE_PROVIDER_SITE_OTHER)

## 2021-08-19 ENCOUNTER — Other Ambulatory Visit

## 2021-08-19 MED ORDER — ibuprofen 800 mg tablet
800 | ORAL_TABLET | ORAL | 0 refills | Status: DC
Start: 2021-08-19 — End: 2021-10-12
  Filled 2021-08-20: qty 90, 30d supply, fill #0

## 2021-08-19 MED ORDER — methylphenidate (Concerta) 36 mg ER tablet
36 | ORAL_TABLET | Freq: Every morning | ORAL | 0 refills | Status: DC
Start: 2021-08-19 — End: 2021-08-21

## 2021-08-20 ENCOUNTER — Other Ambulatory Visit

## 2021-08-21 ENCOUNTER — Other Ambulatory Visit

## 2021-08-21 ENCOUNTER — Ambulatory Visit: Attending: General Acute Care Hospital

## 2021-08-21 ENCOUNTER — Other Ambulatory Visit (INDEPENDENT_AMBULATORY_CARE_PROVIDER_SITE_OTHER)

## 2021-08-21 DIAGNOSIS — Z111 Encounter for screening for respiratory tuberculosis: Secondary | ICD-10-CM

## 2021-08-21 MED ORDER — Concerta 36 mg ER tablet
36 | Freq: Every morning | ORAL | 0 refills | Status: DC
Start: 2021-08-21 — End: 2021-09-02

## 2021-08-21 NOTE — Telephone Encounter (Signed)
Pharmacy called- states concerta 36mg  generic is on back order, unknown time of ETA. Is asking for Brand name Concerta (DAW) 36 mg to be sent in

## 2021-08-21 NOTE — Telephone Encounter (Signed)
Rx sent.

## 2021-08-23 ENCOUNTER — Other Ambulatory Visit

## 2021-08-27 LAB — QUANTIFERON TB GOLD, 1T
Mitogen-NIL: >10 [IU]/mL
NIL: 0.06 [IU]/mL
Quantiferon - TB Gold Test: NEGATIVE
TB1-NIL: 0.01 [IU]/mL
TB2-NIL: 0 [IU]/mL

## 2021-08-31 NOTE — Assessment & Plan Note (Signed)
Annual exam. Immunizations and screening tests UTD. Will check routine labs. Discussed the importance of a balanced diet and getting adequate exercise. Reviewed answers to PHQ9.

## 2021-09-02 ENCOUNTER — Ambulatory Visit (INDEPENDENT_AMBULATORY_CARE_PROVIDER_SITE_OTHER)

## 2021-09-02 ENCOUNTER — Other Ambulatory Visit

## 2021-09-02 ENCOUNTER — Encounter (INDEPENDENT_AMBULATORY_CARE_PROVIDER_SITE_OTHER)

## 2021-09-02 ENCOUNTER — Ambulatory Visit

## 2021-09-02 ENCOUNTER — Encounter

## 2021-09-02 VITALS — BP 120/72 | HR 89 | Resp 17 | Ht 61.0 in | Wt 187.0 lb

## 2021-09-02 DIAGNOSIS — Z0000 Encounter for general adult medical examination without abnormal findings: Secondary | ICD-10-CM

## 2021-09-02 LAB — COMPREHENSIVE METABOLIC PANEL
ALT: 27 U/L (ref 0–55)
AST: 14 U/L (ref 6–42)
Albumin: 4 g/dL (ref 3.2–5.0)
Alkaline phosphatase: 61 U/L (ref 30–130)
Anion Gap: 4 mmol/L (ref 3–14)
BUN: 15 mg/dL (ref 6–24)
Bilirubin, total: 0.4 mg/dL (ref 0.2–1.2)
CO2 (Bicarbonate): 28 mmol/L (ref 20–32)
Calcium: 9.3 mg/dL (ref 8.5–10.5)
Chloride: 107 mmol/L (ref 98–110)
Creatinine: 0.85 mg/dL (ref 0.55–1.30)
Glucose: 107 mg/dL — ABNORMAL HIGH (ref 70–99)
Potassium: 4.3 mmol/L (ref 3.6–5.2)
Protein, total: 7.4 g/dL (ref 6.0–8.4)
Sodium: 139 mmol/L (ref 135–146)
eGFRcr: 87 mL/min/{1.73_m2} (ref >=60)

## 2021-09-02 LAB — LIPID PANEL
Cholesterol: 205 mg/dL — ABNORMAL HIGH (ref <=200)
HDL cholesterol: 45 mg/dL (ref >=40)
LDL cholesterol, calculated: 127 mg/dL (ref 0–130)
Triglycerides: 167 mg/dL — ABNORMAL HIGH (ref <=150)

## 2021-09-02 LAB — CBC
Hematocrit: 40.6 % (ref 32.0–47.0)
Hemoglobin: 13.6 g/dL (ref 11.0–16.0)
MCH: 29.7 pg (ref 26.0–34.0)
MCHC: 33.5 g/dL (ref 31.0–37.0)
MCV: 88.6 fL (ref 80.0–100.0)
MPV: 10.3 fL (ref 9.1–12.4)
NRBC %: 0 % (ref 0.0–0.0)
NRBC Absolute: 0 10*3/uL (ref 0.00–2.00)
Platelets: 253 10*3/uL (ref 150–400)
RBC: 4.58 M/uL (ref 3.70–5.20)
RDW-CV: 12.4 % (ref 11.5–14.5)
RDW-SD: 40.3 fL (ref 35.0–51.0)
WBC: 6.3 10*3/uL (ref 4.0–11.0)

## 2021-09-02 LAB — HEMOGLOBIN A1C
Estimated Average Glucose mg/dL (INT/EXT): 91 mg/dL
HEMOGLOBIN A1C % (INT/EXT): 4.8 % (ref <5.6)

## 2021-09-02 LAB — TSH WITH REFLEX: TSH: 1.9 u[IU]/mL (ref 0.358–3.740)

## 2021-09-02 LAB — HEPATITIS C ANTIBODY: Hepatitis C antibody: NONREACTIVE

## 2021-09-02 LAB — LH: Luteinizing Hormone: 3.2 m[IU]/mL

## 2021-09-02 LAB — FOLLICLE STIMULATING HORMONE: FSH: 1.2 m[IU]/mL

## 2021-09-02 MED ORDER — lisdexamfetamine (Vyvanse) 30 mg capsule
30 | ORAL_CAPSULE | Freq: Every morning | ORAL | 0 refills | Status: DC
Start: 2021-09-02 — End: 2021-09-30

## 2021-09-02 MED ORDER — celecoxib (CeleBREX) 200 mg capsule
200 | ORAL_CAPSULE | Freq: Every day | ORAL | 1 refills | Status: DC
Start: 2021-09-02 — End: 2021-11-12

## 2021-09-02 NOTE — Assessment & Plan Note (Addendum)
Seeing GI. EGD wasn't grossly abnormal but she is very symptomatic. Getting manometry done

## 2021-09-02 NOTE — Assessment & Plan Note (Signed)
As above

## 2021-09-02 NOTE — Assessment & Plan Note (Signed)
Seeing chiropractor for now. Her piriformis is better.

## 2021-09-02 NOTE — Assessment & Plan Note (Signed)
Improved

## 2021-09-02 NOTE — Assessment & Plan Note (Addendum)
Having trouble getting her refills due to shortage of generic cCncerta. Sent in Vyvanse which she has taken in the past.

## 2021-09-02 NOTE — Progress Notes (Signed)
 [facility]  [address]  [address]  [place] [address]  [phone]    Patient ID: [name] is a 46 y.o. female who presents for her annual exam    Concerns today:   Annual Exam  She is doing well but she lost another sister at age 7 from lung cancer.     Recent Consults and Problems:    Office visit in November: She was having a lot of stress on the job so she took a new job. 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 Didn't increase her venlafaxine.    Feels stigmatized regarding her Concerta prescription when she calls for prescription. Generic isn't available and her insurance won't cover the brand name. She used to be on Vyvanse. Apparently her insurance will cover it and she used to take it.    Going for GI for manometry test on Wednesday. EGD didn't show significant GERD. Is on gabapentin which is helping.    R neck pain x 1 year. Thought it was from being on the phone but went to [place] and it locked up on her the day after kayaking.     Screening Tests:  . PAP: 04/2018 NILM, negative HPV, due 2024  . Mammogram: 07/2021 Birads 1  . Colonoscopy: average risk, start age 69  . Bone Density: average risk, start age 60  . LDCT: N/A, never smoker    Immunizations:    Research officer, political party x 4 including bivalent   Hep B x 3   Flu 04/15/2021   Tdap 2018    ROS:   . Skin care: nothing new  . Vision: Lasik, hasn't seen eye specialist, advised her to do so  . Dental care: UTD, flosses  . Periods: had IUD x 16 years; no periods, would like hormone testing, bloated, emotional hot flashes  . Sexual activity: with husband, no issues  . Contraception: Skyla 2021 or 2022  . All other systems negative except as documented above.     Social History:  reports that she has never smoked. She has never used smokeless tobacco. She reports current alcohol use. She reports that she does not use drugs.     . Diet: healthy for the most part  . Exercise: nothing, back is still a  problem, piriformis is better but now she has degenerative changes; seeing [employer], chiropractor.   . Alcohol: minimal  . Drugs: THC gummies couple times a week, no ativan  . Tobacco: never  . Occupation: back at labor and delivery at [employer]  . Household/Family: husband [name], 3 kids [name] [name], [name]      Family History: family history includes Anxiety disorder in her father; Basal cell carcinoma in her father; Cardiomyopathy in her father; Depression in her father; Diabetes in her mother; Drug abuse in her brother; Heart attack in her maternal grandmother; Heart disease in her father; Heart failure in her mother; Hypertension in her mother; Hypothyroidism in her mother; orthostatic tremor in her mother; renal cell cancer in her mother.     . Recent changes in Family History: sister died of lung cancer last week    Medications:    Current Outpatient Medications   Medication Instructions   . albuterol 90 mcg/actuation inhaler inhalation   . azelastine (Astelin) 137 mcg (0.1 %) nasal spray INSTILL 2 SPRAYS INTO EACH NOSTRIL EVERY MORNING AND AT BEDTIME   . butalbital-acetaminophen-caff 50-325-40 mg tablet butalbital-acetaminophen-caffeine 50  mg-325 mg-40 mg tablet   . celecoxib (CELEBREX) 200 mg, oral, Daily   . cyclobenzaprine (Flexeril) 10 mg tablet TAKE 1 TABLET BY MOUTH 3 TIMES A DAY   . fexofenadine (ALLEGRA) 180 mg, oral, Daily   . gabapentin (Neurontin) 300 mg capsule TAKE 1 CAPSULE BY MOUTH EVERY DAY   . gabapentin (Neurontin) 300 mg capsule TAKE 1 CAPSULE BY MOUTH EVERY DAY   . ibuprofen 800 mg tablet TAKE 1 TABLET BY MOUTH 3 TIMES A DAY   . lisdexamfetamine (VYVANSE) 30 mg, oral, Every morning   . LORazepam (ATIVAN) 0.5 mg, oral, Daily PRN   . pantoprazole (ProtoNix) 40 mg EC tablet TAKE 1 TABLET BY MOUTH EVERY DAY   . triamcinolone (Kenalog) 0.1 % cream Apply to affected area 1 to 2 times daily as needed. Avoid face and groin.   Marland Kitchen venlafaxine XR (EFFEXOR-XR) 150 mg, oral, Daily         Past  Medical History:   Diagnosis Date   . Anemia     with pregnancy   . Anxiety    . Asthma    . Migraines             Objective   Visit Vitals  BP 120/72 (BP Location: Left arm, Patient Position: Sitting, BP Cuff Size: Adult)   Pulse 89   Resp 17   Ht 1.549 m Comment: 5'1 in   Wt 84.8 kg Comment: 187 lb   SpO2 99%   BMI 35.33 kg/m   BSA 1.91 m     Wt Readings from Last 10 Encounters:   09/02/21 84.8 kg   04/15/21 81.2 kg   05/23/20 74.8 kg   04/10/20 82 kg   01/30/20 72.6 kg   08/12/19 77.6 kg   06/24/19 85.4 kg   05/04/19 90.3 kg   04/15/19 86.2 kg   04/01/19 83.9 kg        Physical Exam  Constitutional:       General: She is not in acute distress.     Appearance: Normal appearance.   HENT:      Right Ear: Tympanic membrane and ear canal normal.      Left Ear: Tympanic membrane and ear canal normal.      Nose: Nose normal.      Mouth/Throat:      Mouth: Mucous membranes are moist.      Pharynx: Oropharynx is clear.   Eyes:      Conjunctiva/sclera: Conjunctivae normal.   Neck:      Thyroid: No thyroid mass or thyromegaly.   Cardiovascular:      Rate and Rhythm: Normal rate and regular rhythm.      Pulses: Normal pulses.   Pulmonary:      Effort: Pulmonary effort is normal.      Breath sounds: Normal breath sounds.   Chest:   Breasts:     Breasts are symmetrical.      Right: No mass, nipple discharge or skin change.      Left: No mass, nipple discharge or skin change.   Abdominal:      General: Bowel sounds are normal.      Palpations: There is no hepatomegaly or mass.      Tenderness: There is no abdominal tenderness.   Genitourinary:     Vagina: Normal.   Musculoskeletal:      Right shoulder: No tenderness or bony tenderness. Normal range of motion.      Cervical back: Neck supple.  Tenderness (right trapezius) present.        Back:    Lymphadenopathy:      Cervical: No cervical adenopathy.      Upper Body:      Right upper body: No axillary adenopathy.      Left upper body: No axillary adenopathy.   Skin:      Findings: No rash.   Neurological:      General: No focal deficit present.      Mental Status: She is alert.   Psychiatric:         Mood and Affect: Mood normal.         Thought Content: Thought content normal.         Judgment: Judgment normal.         Assessment/Plan   Aris was seen today for annual exam.  Routine health maintenance  Assessment & Plan:  Annual exam. Immunizations and screening tests UTD. Will check routine labs. Discussed the importance of a balanced diet and getting adequate exercise. Reviewed answers to PHQ9.  Orders:  -     CBC; Future  -     Comprehensive metabolic panel; Future  -     Hemoglobin A1c; Future  -     TSH with reflex; Future  -     Lipid panel; Future  -     Luteinizing hormone; Future  -     Follicle stimulating hormone; Future  -     Estradiol; Future  -     HLA-B27 (Disease Association); Future  -     Hepatitis C antibody; Future  Attention deficit disorder of adult  Assessment & Plan:  Having trouble getting her refills due to shortage of generic cCncerta. Sent in Vyvanse which she has taken in the past.   Orders:  -     lisdexamfetamine (Vyvanse) 30 mg capsule; Take 1 capsule (30 mg) by mouth in the morning.  BMI 30.0-30.9,adult  Assessment & Plan:  Eats a fairly healthy diet. Unable to exercise due to back pain.   Chronic right-sided low back pain with right-sided sciatica  Comments:  Will check an HLA-B27. She has problems at the [facility] joint  Assessment & Plan:  Seeing chiropractor for now. Her piriformis is better.   Orders:  -     celecoxib (CeleBREX) 200 mg capsule; Take 1 capsule (200 mg) by mouth in the morning.  Depression with anxiety  Assessment & Plan:  Mood is better now that she has changed jobs. Doing OK on Effexor  Gastroesophageal reflux disease without esophagitis  Assessment & Plan:  Seeing GI. EGD wasn't grossly abnormal but she is very symptomatic. Getting manometry done  Chronic migraine without aura without status migrainosus, not intractable  Assessment &  Plan:  "Have been great"  Obesity (BMI 30.0-34.9)  Assessment & Plan:  As above  Piriformis syndrome of right side  Assessment & Plan:  Improved  Neck pain  Comments:  She will work with chiro for now. Sent in Celebrex as she has bad GERD and is on a lot of NSAIDs.   Orders:  -     celecoxib (CeleBREX) 200 mg capsule; Take 1 capsule (200 mg) by mouth in the morning.    Follow-up for annual exams or prn.

## 2021-09-02 NOTE — Assessment & Plan Note (Signed)
"  Have been great"

## 2021-09-02 NOTE — Assessment & Plan Note (Signed)
Eats a fairly healthy diet. Unable to exercise due to back pain.

## 2021-09-02 NOTE — Assessment & Plan Note (Signed)
Mood is better now that she has changed jobs. Doing OK on Effexor

## 2021-09-03 ENCOUNTER — Encounter (HOSPITAL_BASED_OUTPATIENT_CLINIC_OR_DEPARTMENT_OTHER)

## 2021-09-03 ENCOUNTER — Other Ambulatory Visit (HOSPITAL_BASED_OUTPATIENT_CLINIC_OR_DEPARTMENT_OTHER): Admitting: Internal Medicine

## 2021-09-03 LAB — ESTRADIOL: Estradiol: 88 pg/mL

## 2021-09-04 ENCOUNTER — Encounter

## 2021-09-04 ENCOUNTER — Telehealth (INDEPENDENT_AMBULATORY_CARE_PROVIDER_SITE_OTHER)

## 2021-09-04 ENCOUNTER — Other Ambulatory Visit

## 2021-09-04 ENCOUNTER — Encounter (INDEPENDENT_AMBULATORY_CARE_PROVIDER_SITE_OTHER)

## 2021-09-04 ENCOUNTER — Ambulatory Visit (HOSPITAL_BASED_OUTPATIENT_CLINIC_OR_DEPARTMENT_OTHER)

## 2021-09-04 ENCOUNTER — Ambulatory Visit
Admission: RE | Admit: 2021-09-04 | Discharge: 2021-09-04 | Disposition: A | Discharge status: Home / Self Care | Source: Ambulatory Visit | Attending: Internal Medicine | Admitting: Internal Medicine

## 2021-09-04 ENCOUNTER — Encounter (HOSPITAL_BASED_OUTPATIENT_CLINIC_OR_DEPARTMENT_OTHER)

## 2021-09-04 DIAGNOSIS — F458 Other somatoform disorders: Secondary | ICD-10-CM

## 2021-09-04 DIAGNOSIS — K219 Gastro-esophageal reflux disease without esophagitis: Secondary | ICD-10-CM

## 2021-09-04 MED ORDER — lidocaine (Uro-Jet) 2 % gel  - Omnicell Override Pull
2 | Status: AC
Start: 2021-09-04 — End: ?

## 2021-09-04 MED ORDER — lidocaine (Uro-Jet) 2 % gel 10 mL
2 | Freq: Once | Status: AC
Start: 2021-09-04 — End: 2021-09-04
  Administered 2021-09-04: 13:00:00 6 via NASAL

## 2021-09-04 MED FILL — LIDOCAINE 2 % MUCOSAL JELLY IN APPLICATOR: 2 2 % | Qty: 0.6

## 2021-09-04 MED FILL — LISDEXAMFETAMINE 30 MG CAPSULE: 30 30 mg | ORAL | 30 days supply | Qty: 30 | Fill #0 | Status: CP

## 2021-09-04 NOTE — H&P (Signed)
GI Endoscopy Short H&P:    46 yo woman here for manometry. Previous history and physical reviewed.  Otherwise, no changes necessary.  We reviewed the risks of the procedure which include bleeding, infection, perforation, pancreatitis, and death.  Patient understands, has signed consent, and is willing to proceed.    Medications  Medications     None           Past Medical History   has a past medical history of Anemia, Anxiety, Asthma, GERD (gastroesophageal reflux disease), and Migraines.    Allergies  Codeine    Surgical History   has a past surgical history that includes Tonsillectomy (2002); LASIK (2005); and Esophagogastroduodenoscopy (2022).     Family History  Family History   Problem Relation Name Age of Onset   . Hypertension Mother     . Diabetes Mother     . Hypothyroidism Mother     . Other (renal cell cancer) Mother     . Other (orthostatic tremor) Mother     . Heart failure Mother     . Anxiety disorder Father     . Basal cell carcinoma Father     . Depression Father     . Cardiomyopathy Father     . Heart disease Father     . Drug abuse Brother     . Heart attack Maternal Grandmother         Social History   reports that she has never smoked. She has never used smokeless tobacco. She reports current alcohol use. She reports that she does not currently use drugs after having used the following drugs: Marijuana.    Vitals:  No data recorded        Physical Exam:  General: Alert and oriented, in no acute distress    Lab Results:  Lab on 09/02/2021   Component Date Value Ref Range Status   . WBC 09/02/2021 6.3  4.0 - 11.0 K/uL Final   . RBC 09/02/2021 4.58  3.70 - 5.20 M/uL Final   . Hemoglobin 09/02/2021 13.6  11.0 - 16.0 g/dL Final   . Hematocrit 16/04/9603 40.6  32.0 - 47.0 % Final   . MCV 09/02/2021 88.6  80.0 - 100.0 fL Final   . MCH 09/02/2021 29.7  26.0 - 34.0 pg Final   . MCHC 09/02/2021 33.5  31.0 - 37.0 g/dL Final   . RDW-SD 54/03/8118 40.3  35.0 - 51.0 fL Final   . RDW-CV 09/02/2021 12.4  11.5  - 14.5 % Final   . Platelets 09/02/2021 253  150 - 400 K/uL Final   . MPV 09/02/2021 10.3  9.1 - 12.4 fL Final   . NRBC % 09/02/2021 0.0  0.0 - 0.0 % Final   . NRBC Absolute 09/02/2021 0.00  0.00 - 2.00 K/uL Final   . Sodium 09/02/2021 139  135 - 146 mmol/L Final   . Potassium 09/02/2021 4.3  3.6 - 5.2 mmol/L Final   . Chloride 09/02/2021 107  98 - 110 mmol/L Final   . CO2 (Bicarbonate) 09/02/2021 28  20 - 32 mmol/L Final   . Anion Gap 09/02/2021 4  3 - 14 mmol/L Final   . BUN 09/02/2021 15  6 - 24 mg/dL Final   . Creatinine 14/78/2956 0.85  0.55 - 1.30 mg/dL Final   . eGFRcr 21/30/8657 87  >=60 mL/min/1.52m*2 Final   . Glucose 09/02/2021 107 (H)  70 - 99 mg/dL Final   . Fasting? 84/69/6295 Yes  Final   . Calcium 09/02/2021 9.3  8.5 - 10.5 mg/dL Final   . AST 45/40/9811 14  6 - 42 U/L Final   . ALT 09/02/2021 27  0 - 55 U/L Final   . Alkaline phosphatase 09/02/2021 61  30 - 130 U/L Final   . Protein, total 09/02/2021 7.4  6.0 - 8.4 g/dL Final   . Albumin 91/47/8295 4.0  3.2 - 5.0 g/dL Final   . Bilirubin, total 09/02/2021 0.4  0.2 - 1.2 mg/dL Final   . Hemoglobin A2Z 09/02/2021 4.8  <5.6 % Final    Non-diabetic: < 6.0 %  Diabetic: Goal:  < 7.0%  Action suggested:  > 8.0%   . Estimated Average Glucose 09/02/2021 91  mg/dL Final   . TSH 30/86/5784 1.900  0.358 - 3.740 uIU/mL Final   . Triglycerides 09/02/2021 167 (H)  <=150 mg/dL Final   . Cholesterol 69/62/9528 205 (H)  <=200 mg/dL Final   . HDL cholesterol 09/02/2021 45  >=40 mg/dL Final   . LDL cholesterol, calculated 09/02/2021 127  0 - 130 mg/dL Final   . Luteinizing Hormone 09/02/2021 3.2  mIU/mL Final    Female:  20 - 70 yr:       1.5 - 9.3 mIU/mL  > 70 yr:          3.1 - 34.6 mIU/mL    Children:         <0.1 - 6.0 mIU/mL    Female:   Follicular       1.9 - 12.5 mIU/mL   Midcycle Peak    8.7-76.3 mIU/mL   Luteal Phase     0.5 - 16.9 mIU/mL   Pregnant         <0.1-1.5 mIU/mL   Postmenopausal   15.9-54.0 mIU/mL   Contraceptives   0.7-5.6 mIU/mL   . FSH 09/02/2021  1.2  mIU/mL Final    Males 13-70 yr: 1.4 - 18.1 mIU/mL    Females:  Follicular Phase: 2.5 - 10.2 mIU/mL  Midcycle Peak: 3.4 - 33.4 mIU/mL  Luteal Phase: 1.5 - 9.1 mIU/mL  Pregnant: <0.3 mIU/mL  Postmenopausal: 23.0 - 116.3 mIU/mL   . Estradiol 09/02/2021 88  pg/mL Final    Female: <40 pg/mL    Female:  Follicular Phase: 12-166 pg/mL  Midcycle: 63.9 - 357 pg/mL  Luteal Phase: 56-214 pg/mL  Post-menopausal: <32 pg/mL  Pregnancy 1st trimester: [phone] pg/mL    Boys: 1-10 years: 5-20 pg/mL  Girls: 1-10 years: 6-27 pg/mL   . HLA B27 09/02/2021 Negative   Final    HLA-B27 is associated with certain autoimmune and immune-mediated diseases, such as Spondyloarthritides: Aankylosing spondylitis, Reactive arthritis, Colitis-associated spondyloarthritis, Psoriatic spondyloarthritis, Juvenile enthesitis-related arthritis, and Acute anterior uveitis.    This test was developed and it's characteristics determined by the HLA Laboratory at [facility]. It has not been cleared or approved by the U.S. Food and Drug Administration. The FDA has determined that such clearance or approval is not necessary. The laboratory is regulated under the Clinical Laboratory Improvement Amendments of 1988 (CLIA) as qualified to perform high complexity clinical testing.      Laboratory Director Ladon Applebaum, MD, PhD  CLIA# [id number]  ASHI# [id number]     . Hepatitis C antibody 09/02/2021 Non Reactive  Non Reactive Final

## 2021-09-04 NOTE — Telephone Encounter (Signed)
Patient needs PA for celebrex 200 mg  and vyvanse 30 mg capsules have both been approved.

## 2021-09-05 ENCOUNTER — Other Ambulatory Visit

## 2021-09-05 ENCOUNTER — Ambulatory Visit
Admission: RE | Admit: 2021-09-05 | Discharge: 2021-09-05 | Disposition: A | Discharge status: Home / Self Care | Source: Ambulatory Visit

## 2021-09-05 NOTE — Op Note (Signed)
Patient arrived for removal of 24hour pH probe. Probe removed without difficulty, diary reviewed. Patient aware that she will receive results in 1-2 weeks.

## 2021-09-12 LAB — HLA-B27 (DISEASE ASSOCIATION): HLA B27: NEGATIVE

## 2021-09-13 ENCOUNTER — Encounter (INDEPENDENT_AMBULATORY_CARE_PROVIDER_SITE_OTHER)

## 2021-09-27 ENCOUNTER — Other Ambulatory Visit

## 2021-09-27 MED ORDER — gabapentin (Neurontin) 100 mg capsule
100 | ORAL_CAPSULE | ORAL | 2 refills | Status: DC
Start: 2021-09-27 — End: 2021-11-11
  Filled 2021-09-27: qty 30, 30d supply, fill #0

## 2021-09-27 MED ORDER — semaglutide, weight loss, (Wegovy) 0.25 mg/0.5 mL pen injector
0.25 | SUBCUTANEOUS | 0 refills | 28.00000 days | Status: DC
Start: 2021-09-27 — End: 2021-10-01
  Filled 2021-09-27: qty 2, 28d supply, fill #0

## 2021-09-30 ENCOUNTER — Other Ambulatory Visit (INDEPENDENT_AMBULATORY_CARE_PROVIDER_SITE_OTHER)

## 2021-09-30 ENCOUNTER — Ambulatory Visit
Admission: AD | Admit: 2021-09-30 | Discharge: 2021-09-30 | Disposition: A | Discharge status: Home / Self Care | Source: Home / Self Care | Attending: Family Medicine | Admitting: Family Medicine

## 2021-09-30 ENCOUNTER — Ambulatory Visit

## 2021-09-30 ENCOUNTER — Other Ambulatory Visit

## 2021-09-30 ENCOUNTER — Encounter (INDEPENDENT_AMBULATORY_CARE_PROVIDER_SITE_OTHER)

## 2021-09-30 VITALS — BP 138/88 | HR 79 | Temp 98.1°F | Resp 16 | Ht 62.0 in | Wt 179.0 lb

## 2021-09-30 DIAGNOSIS — M79641 Pain in right hand: Secondary | ICD-10-CM

## 2021-09-30 NOTE — ED Provider Notes (Signed)
History  Chief Complaint   Patient presents with   . 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       46 year old female presents for evaluation of right dorsal hand pain.  She reports that she had an unspecified injury sometime about 2 to 2-1/2 weeks ago, does not recall specifically what she did, but has subsequently had pain over the distal fifth metacarpal that radiates into the fourth metacarpal at times.  Reports that she has noticed some swelling.  Tried icing and initially, takes ibuprofen from time to time but is still having pain and therefore is requesting an x-ray.  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  Denies any numbness or tingling, but reports that she has a sensation of mildly decreased strength on full flexion of her fifth finger.          Past Medical History:   Diagnosis Date   . Anemia     with pregnancy   . Anxiety    . Asthma    . GERD (gastroesophageal reflux disease)    . Migraines        Past Surgical History:   Procedure Laterality Date   . ESOPHAGOGASTRODUODENOSCOPY  2022   . LASIK  2005   . TONSILLECTOMY  2002       Family History   Problem Relation Name Age of Onset   . Hypertension Mother     . Diabetes Mother     . Hypothyroidism Mother     . Other (renal cell cancer) Mother     . Other (orthostatic tremor) Mother     . Heart failure Mother     . Anxiety disorder Father     . Basal cell carcinoma Father     . Depression Father     . Cardiomyopathy Father     . Heart disease Father     . Drug abuse Brother     . Heart attack Maternal Grandmother         Social History     Tobacco Use   . Smoking status: Never   . Smokeless tobacco: Never   Vaping Use   . Vaping status: Never Used   Substance Use Topics   . Alcohol use: Yes     Comment: socially   . Drug use: Never     Comment: CBD oil on back/cbd edible at bedtime        Review of Systems    Physical Exam  Vitals:    09/30/21 1542 09/30/21 1547   BP: 138/88    BP Location: Left arm    Pulse: 79    Resp: 16    Temp: 36.7 C (98.1 F)    TempSrc: Oral    SpO2: 99%    Weight:  81.2 kg   Height:  1.575 m       Physical Exam  Vitals and nursing note reviewed.   Constitutional:       General: She is not in acute distress.     Appearance: She is well-developed.   HENT:      Head: Normocephalic and atraumatic.   Eyes:      Conjunctiva/sclera: Conjunctivae normal.   Cardiovascular:      Rate and Rhythm: Regular rhythm.  Heart sounds: No murmur heard.  Pulmonary:      Effort: Pulmonary effort is normal. No respiratory distress.   Musculoskeletal:      Comments: Right dorsal hand without obvious deformity, swelling, there is trace ecchymosis over the fifth metacarpal head.  She has full range of motion of all of the fingers.  She has mild tenderness palpation over the fifth metacarpal head.  Intact thumbs up, okay, interosseous strength.  Two-point discrimination intact.  Capillary refill less than 2 seconds.   Skin:     General: Skin is warm and dry.   Neurological:      Mental Status: She is alert.              XR HAND RIGHT 3+ VIEWS   Final Result      No acute or subacute osseous abnormality detected.      Old right fifth metacarpal boxer's fracture in advanced stages of healing.      [name] Richters, MD 09/30/2021 4:11 PM        Labs Reviewed - No data to display    Procedures  Procedures    UC Course  Diagnoses as of 09/30/21 1622   Pain of right hand       Medical Decision Making  46 year old female presents for evaluation of right dorsal hand pain.  X-ray without any evidence of acute fracture dislocation greater than 2-1/2 weeks following injury.  Radiology read indicative of old fracture in this location at advanced stages of healing not consistent with a 87-week old injury.  Discussed with patient.  Suspect contusion.  Advised of recommendation for rest, ice, Tylenol,  ibuprofen as needed.  Recommend repeat evaluation with any new, worsening or persistent symptoms.  Number for orthopedics provided to patient who verbalized understanding and agreement with plan.    Pain of right hand: complicated acute illness or injury  Amount and/or Complexity of Data Reviewed  Radiology: ordered and independent interpretation performed. Decision-making details documented in ED Course.          Discharge Meds  ED Prescriptions    None         Home Meds  Prior to Admission medications    Medication Sig Start Date End Date Taking? Authorizing Provider   albuterol 90 mcg/actuation inhaler Inhale. 03/23/20  Yes Historical Provider, MD   celecoxib (CeleBREX) 200 mg capsule Take 1 capsule (200 mg) by mouth in the morning. 09/02/21 03/01/22 Yes [name], NP   fexofenadine (Allegra) 180 mg tablet Take 180 mg by mouth in the morning.   Yes Historical Provider, MD   gabapentin (Neurontin) 100 mg capsule Take 1 capsule by oral route at noon. 09/27/21  Yes    gabapentin (Neurontin) 300 mg capsule TAKE 1 CAPSULE BY MOUTH EVERY DAY 07/31/21  Yes    ibuprofen 800 mg tablet TAKE 1 TABLET BY MOUTH 3 TIMES A DAY 08/19/21 08/19/22 Yes [name], NP   lisdexamfetamine (Vyvanse) 30 mg capsule Take 1 capsule (30 mg) by mouth in the morning. 09/02/21 10/05/21 Yes [name], NP   pantoprazole (ProtoNix) 40 mg EC tablet TAKE 1 TABLET BY MOUTH EVERY DAY 07/30/21 07/30/22 Yes [name], NP   semaglutide, weight loss, (Wegovy) 0.25 mg/0.5 mL pen injector Inject 0.25 mg every week by subcutaneous route. 09/27/21  Yes    venlafaxine XR (Effexor-XR) 150 mg 24 hr capsule Take 1 capsule (150 mg) by mouth in the morning. 11/28/20 10/19/21 Yes [name], NP  Total amount of time spent on day of service doing chart review, history and physical exam, order/ review results of testing ordered (if any), patient counseling, documentation: 30 minutes.      Patient encounter note may have been created using voice  recognition software and in real time during the office visit. Please excuse any typographical errors that may not have been edited out.            Lonia Skinner, MD  09/30/21 [phone]

## 2021-09-30 NOTE — Discharge Instructions (Addendum)
You were seen here for hand injury. Your x-rays showed that you did not have any broken bones that are new, but that there appears to be an old, fracture in advance stages of healing in this location. Use rest, ice, elevation, NSAID, Tylenol as needed.  Please consider following up with your primary care physician and/or orthopedics if your pain is not improving for reevaluation.

## 2021-10-01 MED ORDER — lisdexamfetamine (Vyvanse) 30 mg capsule
30 | ORAL_CAPSULE | Freq: Every morning | ORAL | 0 refills | Status: DC
Start: 2021-10-01 — End: 2021-11-12
  Filled 2021-10-03: qty 30, 30d supply, fill #0

## 2021-10-12 MED FILL — VENLAFAXINE ER 150 MG CAPSULE,EXTENDED RELEASE 24 HR: 150 150 mg | ORAL | 90 days supply | Qty: 90 | Fill #3 | Status: CP

## 2021-10-12 MED FILL — PANTOPRAZOLE 40 MG TABLET,DELAYED RELEASE: 40 40 mg | ORAL | 90 days supply | Qty: 90 | Fill #1 | Status: CP

## 2021-10-14 MED ORDER — ibuprofen 800 mg tablet
800 | ORAL_TABLET | ORAL | 0 refills | Status: DC
Start: 2021-10-14 — End: 2021-12-15
  Filled 2021-10-14: qty 90, 30d supply, fill #0

## 2021-10-15 ENCOUNTER — Inpatient Hospital Stay: Admit: 2021-10-15 | Primary: Family Medicine

## 2021-10-15 ENCOUNTER — Other Ambulatory Visit: Admit: 2021-10-15 | Primary: Family Medicine

## 2021-10-15 DIAGNOSIS — M9901 Segmental and somatic dysfunction of cervical region: Secondary | ICD-10-CM

## 2021-10-15 DIAGNOSIS — Z111 Encounter for screening for respiratory tuberculosis: Secondary | ICD-10-CM

## 2021-10-21 LAB — QUANTIFERON TB GOLD, 1T
Mitogen-NIL: >10 [IU]/mL
NIL: 0.06 [IU]/mL
Quantiferon - TB Gold Test: NEGATIVE
TB1-NIL: 0.01 [IU]/mL
TB2-NIL: 0 [IU]/mL

## 2021-10-23 MED ORDER — gabapentin (Neurontin) 300 mg capsule
300 | ORAL_CAPSULE | Freq: Every day | ORAL | 1 refills | Status: DC
Start: 2021-10-23 — End: 2021-11-11
  Filled 2021-10-23: qty 90, 90d supply, fill #0

## 2021-11-12 ENCOUNTER — Inpatient Hospital Stay: Admit: 2021-11-12 | Discharge: 2021-11-12 | Attending: Internal Medicine | Primary: Family Medicine

## 2021-11-12 DIAGNOSIS — K317 Polyp of stomach and duodenum: Secondary | ICD-10-CM

## 2021-11-12 DIAGNOSIS — K224 Dyskinesia of esophagus: Secondary | ICD-10-CM

## 2021-11-12 LAB — POCT PREGNANCY, URINE: POC hCG Qual, Ur: NEGATIVE

## 2021-11-12 MED ORDER — lidocaine (Xylocaine) 10 mg/mL (1 %) injection
10 | INTRAMUSCULAR | Status: DC | PRN
Start: 2021-11-12 — End: 2021-11-12
  Administered 2021-11-12: 15:00:00 40 via INTRAVENOUS

## 2021-11-12 MED ORDER — lisdexamfetamine (Vyvanse) 30 mg capsule
30 | ORAL_CAPSULE | Freq: Every morning | ORAL | 0 refills | Status: DC
Start: 2021-11-12 — End: 2021-12-15
  Filled 2021-11-12: qty 30, 30d supply, fill #0

## 2021-11-12 MED ORDER — ondansetron (Zofran) injection
4 | INTRAMUSCULAR | Status: DC | PRN
Start: 2021-11-12 — End: 2021-11-12
  Administered 2021-11-12: 15:00:00 4 via INTRAVENOUS

## 2021-11-12 MED ORDER — lactated Ringer's infusion
INTRAVENOUS | Status: DC
Start: 2021-11-12 — End: 2021-11-13
  Administered 2021-11-12: 14:00:00 50 mL/h via INTRAVENOUS
  Administered 2021-11-12: 15:00:00 via INTRAVENOUS

## 2021-11-12 MED ORDER — lidocaine PF (Xylocaine) 10 mg/mL (1 %) injection  - Omnicell Override Pull
10 | INTRAMUSCULAR | Status: AC
Start: 2021-11-12 — End: ?

## 2021-11-12 MED ORDER — onabotulinumtoxinA (Botox) injection 200 Units
200 | Freq: Once | INTRAMUSCULAR | Status: AC
Start: 2021-11-12 — End: 2021-11-12
  Administered 2021-11-12: 15:00:00 200 [IU] via INTRAMUSCULAR

## 2021-11-12 MED ORDER — propofol (Diprivan) 10 mg/mL injection  - Omnicell Override Pull
10 | INTRAVENOUS | Status: AC
Start: 2021-11-12 — End: ?

## 2021-11-12 MED ORDER — ondansetron (Zofran) 4 mg/2 mL injection  - Omnicell Override Pull
4 | INTRAMUSCULAR | Status: AC
Start: 2021-11-12 — End: ?

## 2021-11-12 MED ORDER — propofol (Diprivan) infusion 10 mg/mL
10 | INTRAVENOUS | Status: DC | PRN
Start: 2021-11-12 — End: 2021-11-12
  Administered 2021-11-12: 15:00:00 100 via INTRAVENOUS
  Administered 2021-11-12: 15:00:00 50 via INTRAVENOUS
  Administered 2021-11-12: 15:00:00 100 via INTRAVENOUS

## 2021-11-12 MED ORDER — phenylephrine (Neo-Synephrine) injection
10 | INTRAMUSCULAR | Status: DC | PRN
Start: 2021-11-12 — End: 2021-11-12
  Administered 2021-11-12: 15:00:00 80 via INTRAVENOUS

## 2021-11-12 MED ORDER — phenylephrine in NS 0.4 mg/10 mL (40 mcg/mL) syringe  - Omnicell Override Pull
0.4 | INTRAVENOUS | Status: AC
Start: 2021-11-12 — End: ?

## 2021-11-12 MED FILL — ONDANSETRON HCL (PF) 4 MG/2 ML INJECTION SOLUTION: 4 4 mg/2 mL | INTRAMUSCULAR | Qty: 2

## 2021-11-12 MED FILL — PHENYLEPHRINE 0.4 MG/10 ML (40 MCG/ML) IN 0.9 %SOD.CHLORIDE IV SYRINGE: 0.4 0.4 mg/10 mL (40 mcg/mL) | INTRAVENOUS | Qty: 10

## 2021-11-12 MED FILL — LIDOCAINE (PF) 10 MG/ML (1 %) INJECTION SOLUTION: 10 10 mg/mL (1 %) | INTRAMUSCULAR | Qty: 4

## 2021-11-12 MED FILL — PROPOFOL 10 MG/ML INTRAVENOUS EMULSION: 10 10 mg/mL | INTRAVENOUS | Qty: 20

## 2021-11-12 MED FILL — ONABOTULINUMTOXINA 200 UNIT SOLUTION FOR INJECTION: 200 200 unit | INTRAMUSCULAR | Qty: 200

## 2021-11-12 NOTE — Anesthesia Post-Procedure Evaluation (Signed)
Patient: [name]    Procedure Summary     Date: 11/12/21 Room / Location: [facility]    Anesthesia Start: 1047 Anesthesia Stop: 1118    Procedure: EGD Diagnosis:       Dyskinesia of esophagus      (Esophageal motility disorder)    Scheduled Providers: Caralee AtesMelanie Kappadakunnel, MD; Vickki HearingGopala Rakeb Secord, MD; Bartholome Billrisha Bui, CRNA Responsible Provider: Vickki HearingGopala Maudell Stanbrough, MD    Anesthesia Type: MAC ASA Status: 2          Anesthesia Type: MAC    Vitals Value Taken Time   BP 110/70 11/12/21 1210   Temp 97 11/12/21 1252   Pulse 60 11/12/21 1210   Resp 16 11/12/21 1210   SpO2 99 % 11/12/21 1210       Anesthesia Post Evaluation Note:    Patient location during evaluation: Endo Recovery  Patient participation: able to participate  Level of consciousness: awake  Cardiovascular and Hydration status: vital signs within acceptable range       Aldrete score reviewed: yes  Vitals reviewed: yes  Unplanned ICU Admission: noPatient has recovered from anesthesia and has returned to baseline mental status, cardiovascular and respiratory function. Pain, nausea, and vomiting are adequately controlled and the patient is adequately hydrated and appropriate for discharge from PACU?: yes      No notable events documented.

## 2021-11-12 NOTE — Anesthesia Pre-Procedure Evaluation (Addendum)
Patient: [name]    Procedure Information     Date/Time: 11/12/21 1030    Scheduled providers: Caralee AtesMelanie Kappadakunnel, MD; Vickki HearingGopala Buford Bremer, MD; Bartholome Billrisha Bui, CRNA    Procedure: EGD    Location: [facility]          Relevant Problems   Pulmonary   (+) Exercise-induced asthma      GI   (+) GERD (gastroesophageal reflux disease)      Neuro/Psych   (+) Anxiety   (+) Chronic low back pain   (+) Depression with anxiety   (+) Migraine headache      Other   (+) Piriformis syndrome of right side       Clinical information reviewed:   Tobacco  Allergies  Meds   Med Hx  Surg Hx  OB Status  Fam Hx  Soc   Hx         Physical Exam    Airway  Mallampati: I  TM distance: >3 FB  Mouth opening: >3 FB  Neck ROM: full  Able to protrude mandible  No external dysmorphic features noted       Cardiovascular - normal exam     Dental - normal exam     Pulmonary - normal exam     Abdominal - normal exam     General   Alert                 Anesthesia Plan    ASA 2   NPO status verified    MAC     Airway: natural airway  Monitoring: standard monitors    Essential imaging and labs available and reviewed    Anesthetic plan and risks discussed with patient.  Preprocedure Evaluation: No items outstanding.

## 2021-11-12 NOTE — Other (Signed)
Patient Education   Table of Contents     Monitored Anesthesia Care, Care After     Upper Endoscopy, Adult, Care After     To view videos and all your education online visit,   [email or web address]   or scan this QR code with your smartphone.   Access to this content will expire in one year.                    Monitored Anesthesia Care, Care After     This sheet gives you information about how to care for yourself after your procedure. Your health care provider may also give you more specific instructions. If you have problems or questions, contact your health care provider.   What can I expect after the procedure?    After the procedure, it is common to have:     Tiredness.     Forgetfulness about what happened after the procedure.     Impaired judgment for important decisions.     Nausea or vomiting.     Some difficulty with balance.     Follow these instructions at home:   For the time period you were told by your health care provider:           Rest as needed.    Do not  participate in activities where you could fall or become injured.    Do not  drive or use machinery.    Do not  drink alcohol.    Do not  take sleeping pills or medicines that cause drowsiness.    Do not  make important decisions or sign legal documents.    Do not  take care of children on your own.       Eating and drinking     Follow the diet that is recommended by your health care provider.     Drink enough fluid to keep your urine pale yellow.    If you vomit:     Drink water, juice, or soup when you can drink without vomiting.     Make sure you have little or no nausea before eating solid foods.     General instructions     Have a responsible adult stay with you for the time you are told. It is important to have someone help care for you until you are awake and alert.     Take over-the-counter and prescription medicines only as told by your health care provider.    If you have sleep apnea, surgery and certain medicines can  increase your risk for breathing problems. Follow instructions from your health care provider about wearing your sleep device:     Anytime you are sleeping, including during daytime naps.     While taking prescription pain medicines, sleeping medicines, or medicines that make you drowsy.     Avoid smoking.     Keep all follow-up visits as told by your health care provider. This is important.       Contact a health care provider if:       You keep feeling nauseous or you keep vomiting.     You feel light-headed.     You are still sleepy or having trouble with balance after 24 hours.     You develop a rash.     You have a fever.     You have redness or swelling around the IV site.     Get help right away if:  You have trouble breathing.     You have new-onset confusion at home.     Summary       For several hours after your procedure, you may feel tired. You may also be forgetful and have poor judgment.     Have a responsible adult stay with you for the time you are told. It is important to have someone help care for you until you are awake and alert.     Rest as told. Do not  drive or operate machinery. Do not  drink alcohol or take sleeping pills.     Get help right away if you have trouble breathing, or if you suddenly become confused.     This information is not intended to replace advice given to you by your health care provider. Make sure you discuss any questions you have with your health care provider.     Document Released: 04/09/2017Document Revised: 11/22/2022Document Reviewed: 05/26/2019     Elsevier Patient Education ? [place]         Upper Endoscopy, Adult, Care After     This sheet gives you information about how to care for yourself after your procedure. Your health care provider may also give you more specific instructions. If you have problems or questions, contact your health care provider.   What can I expect after the procedure?    After the procedure, it is common to have:     A  sore throat.     Mild stomach pain or discomfort.     Bloating.     Nausea.     Follow these instructions at home:          Follow instructions from your health care provider about what to eat or drink after your procedure.     Return to your normal activities as told by your health care provider. Ask your health care provider what activities are safe for you.     Take over-the-counter and prescription medicines only as told by your health care provider.     If you were given a sedative during the procedure, it can affect you for several hours. Do not  drive or operate machinery until your health care provider says that it is safe.     Keep all follow-up visits as told by your health care provider. This is important.       Contact a health care provider if you have:       A sore throat that lasts longer than one day.     Trouble swallowing.     Get help right away if:       You vomit blood or your vomit looks like coffee grounds.    You have:     A fever.     Bloody, black, or tarry stools.     A severe sore throat or you cannot swallow.     Difficulty breathing.     Severe pain in your chest or abdomen.     Summary       After the procedure, it is common to have a sore throat, mild stomach discomfort, bloating, and nausea.     If you were given a sedative during the procedure, it can affect you for several hours. Do not  drive or operate machinery until your health care provider says that it is safe.     Follow instructions from your health care provider about what to eat or drink after your  procedure.     Return to your normal activities as told by your health care provider.     This information is not intended to replace advice given to you by your health care provider. Make sure you discuss any questions you have with your health care provider.     Document Released: 06/18/2013Document Revised: 10/23/2020Document Reviewed: 11/23/2017     Elsevier Patient Education ? [place].

## 2021-11-12 NOTE — Op Note (Signed)
Dr. Hilaria Ota in to examine pt. They decided for pt. To take her gabapentin and see if this helps her discomfort.

## 2021-11-12 NOTE — Telephone Encounter (Signed)
pmp last filled 10/03/21

## 2021-11-12 NOTE — Op Note (Signed)
Pt says she feels much better and wants to go home. Cleared by MD to leave. W/C ride offered and declined. Accompanied by her husband.

## 2021-11-12 NOTE — H&P (Signed)
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 s    Previous history and physical reviewed.  Otherwise, no changes necessary.  We reviewed the risks of the procedure which include bleeding, infection, perforation, and death.  Patient understands, has signed consent, and is willing to proceed.    Medications  Medications      Start Medication Dose/Rate, Route, Frequency Ordered Stop    11/12/21 1030 onabotulinumtoxinA (Botox) injection 200 Units         200 Units, IM, Once 11/12/21 1015 --    11/12/21 1000 lactated Ringer's infusion         50 mL/hr, IV, Continuous 11/12/21 0928 --                 Past Medical History   has a past medical history of Anemia, Anxiety, Asthma, GERD (gastroesophageal reflux disease), and Migraines.    Allergies  Codeine    Surgical History   has a past surgical history that includes Tonsillectomy (2002); LASIK (2005); and Esophagogastroduodenoscopy (2022).     Family History  Family History   Problem Relation Name Age of Onset   . Hypertension Mother     . Diabetes Mother     . Hypothyroidism Mother     . Other (renal cell cancer) Mother     . Other (orthostatic tremor) Mother     . Heart failure Mother     . Anxiety disorder Father     . Basal cell carcinoma Father     . Depression Father     . Cardiomyopathy Father     . Heart disease Father     . Drug abuse Brother     . Heart attack Maternal Grandmother         Social History   reports that she has never smoked. She has never used smokeless tobacco. She reports current alcohol use. She reports that she does not currently use drugs after having used the following drugs: Marijuana.    Vitals:  Temp: 36.2 C (97.2 F)  Pulse: 73  BP: 113/78  Resp: 16  SpO2: 99 %    Temp:  [36.2 C (97.2 F)] 36.2 C (97.2 F)  Pulse:  [73] 73  Resp:  [16] 16  BP: (113)/(78) 113/78     Physical Exam:  General: Alert and oriented, in no acute distress  HEENT: no scleral icterus  Resp: breathing comfortably  on room air  Abdomen: soft, non-tender, non-distended  Ext: no edema    Lab Results:  Hospital Outpatient Visit on 11/12/2021   Component Date Value Ref Range Status   . Preg Test, Ur 11/12/2021 Negative   Final     Plan:  EGD with Botox injection

## 2021-11-14 LAB — TISSUE PATHOLOGY

## 2021-12-04 ENCOUNTER — Encounter

## 2021-12-04 ENCOUNTER — Ambulatory Visit (INDEPENDENT_AMBULATORY_CARE_PROVIDER_SITE_OTHER)

## 2021-12-16 MED ORDER — ondansetron ODT (Zofran-ODT) 4 mg disintegrating tablet
4 | ORAL_TABLET | ORAL | 0 refills | Status: DC
Start: 2021-12-16 — End: 2022-01-16
  Filled 2021-12-16: qty 15, 15d supply, fill #0

## 2021-12-16 MED ORDER — lisdexamfetamine (Vyvanse) 30 mg capsule
30 | ORAL_CAPSULE | Freq: Every morning | ORAL | 0 refills | Status: DC
Start: 2021-12-16 — End: 2022-01-16
  Filled 2021-12-16: qty 30, 30d supply, fill #0

## 2021-12-16 MED ORDER — liraglutide, weight loss, (Saxenda) 3 mg/0.5 mL (18 mg/3 mL) pen injector
3 | SUBCUTANEOUS | 1 refills | 30.00000 days | Status: DC
Start: 2021-12-16 — End: 2022-01-16
  Filled 2021-12-16: qty 15, 30d supply, fill #0

## 2021-12-16 MED ORDER — ibuprofen 800 mg tablet
800 | ORAL_TABLET | ORAL | 0 refills | Status: AC
Start: 2021-12-16 — End: 2022-12-16
  Filled 2021-12-16: qty 90, 30d supply, fill #0

## 2021-12-16 MED ORDER — pen needle, diabetic (BD Ultra-Fine Nano Pen Needle) 32 gauge x 5/32" needle
32 | 0 refills | 90.00000 days | Status: AC
Start: 2021-12-16 — End: ?
  Filled 2021-12-16: qty 100, 90d supply, fill #0

## 2022-01-16 ENCOUNTER — Ambulatory Visit: Admit: 2022-01-16 | Discharge: 2022-01-16 | Primary: Family Medicine

## 2022-01-16 ENCOUNTER — Encounter: Primary: Family Medicine

## 2022-01-16 DIAGNOSIS — M7711 Lateral epicondylitis, right elbow: Secondary | ICD-10-CM

## 2022-01-16 MED ORDER — pantoprazole (ProtoNix) 40 mg EC tablet
40 | ORAL_TABLET | ORAL | 1 refills | Status: AC
Start: 2022-01-16 — End: ?
  Filled 2022-01-16: qty 90, 90d supply, fill #0

## 2022-01-16 MED ORDER — gabapentin (Neurontin) 100 mg capsule
100 | ORAL_CAPSULE | ORAL | 2 refills | Status: DC
Start: 2022-01-16 — End: 2022-01-16
  Filled 2022-01-16: qty 60, 30d supply, fill #0

## 2022-01-16 MED ORDER — lisdexamfetamine (Vyvanse) 30 mg capsule
30 | ORAL_CAPSULE | Freq: Every morning | ORAL | 0 refills | Status: DC
Start: 2022-01-16 — End: 2022-03-21
  Filled 2022-01-16: qty 30, 30d supply, fill #0

## 2022-01-16 MED ORDER — predniSONE (Deltasone) 10 mg tablet
10 | ORAL_TABLET | ORAL | 0 refills | Status: AC
Start: 2022-01-16 — End: 2022-01-29
  Filled 2022-01-16: qty 42, 12d supply, fill #0

## 2022-01-16 NOTE — Progress Notes (Signed)
[facility]  [address]  [place] [address]  [place] [address]  [phone]    Patient ID: [name] is a 46 y.o. female who presents for Arm Pain (Rt arm pain for few weeks. No hx of trauma, has been taking IBU and ice pack with no improvement. ).    Subjective   HPI  Her right elbow has been hurting for a couple of weeks. . I treated her for tennis elbow in the past. Hard to pick things up and it's painful. Using a tennis elbow brace. She feels it's from a repetitive motion she does at work or possibly related to holding her cell phone. Taking ibuprofen 800 mg TID since Monday.     Medications:  Current Outpatient Medications   Medication Instructions   . albuterol 90 mcg/actuation inhaler inhalation   . fexofenadine (ALLEGRA) 180 mg, oral, Daily   . gabapentin (Neurontin) 100 mg capsule 1 capsule, Daily   . gabapentin (Neurontin) 100 mg capsule Take 2 capsules by mouth every day at noon.   Marland Kitchen ibuprofen 800 mg tablet TAKE 1 TABLET BY MOUTH 3 TIMES A DAY   . liraglutide, weight loss, (Saxenda) 3 mg/0.5 mL (18 mg/3 mL) pen injector INJECT 0.6 MG SUBCUTANEOUSLY DAILY FOR 1 WEEK THEN INCREASE DOSE BY 0.6 MG EVERY DAY UNTIL 3 MG. MAX: 3 MG PER DAY.   Marland Kitchen ondansetron ODT (Zofran-ODT) 4 mg disintegrating tablet DISSOLVE 1 TABLET ON THE TONGUE EVERY DAY AS NEEDED   . pantoprazole (ProtoNix) 40 mg EC tablet TAKE 1 TABLET BY MOUTH EVERY DAY   . pantoprazole (ProtoNix) 40 mg EC tablet TAKE 1 TABLET BY MOUTH EVERY DAY   . pen needle, diabetic (BD Ultra-Fine Nano Pen Needle) 32 gauge x 5/32" needle Use as directed daily for injecting under the skin.   . predniSONE (Deltasone) 10 mg tablet Take 6 tablets (60 mg) by mouth once daily for 2 days, THEN 5 tablets (50 mg) once daily for 2 days, THEN 4 tablets (40 mg) once daily for 2 days, THEN 3 tablets (30 mg) once daily for 2 days, THEN 2 tablets (20 mg) once daily for 2 days, THEN 1 tablet (10 mg) once daily for 2 days.   Marland Kitchen venlafaxine XR (Effexor-XR) 150 mg 24 hr capsule  Take 1 capsule (150 mg) by mouth in the morning.   . Vyvanse 30 mg, oral, Every morning   . Wegovy 0.25 mg        Past Medical History:   Diagnosis Date   . Anemia     with pregnancy   . Anxiety    . Asthma    . GERD (gastroesophageal reflux disease)    . Migraines       ROS  Objective   Visit Vitals  BP 122/80 (BP Location: Left arm, Patient Position: Sitting, BP Cuff Size: Adult)   Pulse 78   Wt 81.6 kg Comment: 180lb   SpO2 98%   BMI 32.92 kg/m   OB Status Implant   BSA 1.89 m       Physical Exam  Constitutional:       Appearance: Normal appearance.   Musculoskeletal:      Right elbow: Normal range of motion. Tenderness present in lateral epicondyle.      Comments: Painful resisted supination   Neurological:      Mental Status: She is alert.         Assessment/Plan   [name] was seen today for arm  pain.  Right tennis elbow  -     predniSONE (Deltasone) 10 mg tablet; Take 6 tablets (60 mg) by mouth once daily for 2 days, THEN 5 tablets (50 mg) once daily for 2 days, THEN 4 tablets (40 mg) once daily for 2 days, THEN 3 tablets (30 mg) once daily for 2 days, THEN 2 tablets (20 mg) once daily for 2 days, THEN 1 tablet (10 mg) once daily for 2 days.  -     [facility]  [facility]; Future    She hasn't responded to NSAIDs so will try a course of prednisone. Advised taking prednisone early in the day, take with food and avoid NSAIDs. Reviewed potential side effects. I also referred her to Dr. Renee Rivalomany. If it resolves she can cancel. We also discussed PT but she doesn't have time for it.

## 2022-01-28 MED ORDER — venlafaxine XR (Effexor-XR) 150 mg 24 hr capsule
150 | ORAL_CAPSULE | Freq: Every day | ORAL | 3 refills | Status: AC
Start: 2022-01-28 — End: 2022-04-29
  Filled 2022-01-28: qty 90, 90d supply, fill #0

## 2022-01-31 MED ORDER — gabapentin (Neurontin) 100 mg capsule
100 | ORAL_CAPSULE | ORAL | 2 refills | Status: AC
Start: 2022-01-31 — End: ?
  Filled 2022-01-31: qty 60, 30d supply, fill #0

## 2022-02-13 MED ORDER — meloxicam (Mobic) 15 mg tablet
15 | ORAL_TABLET | ORAL | 2 refills | Status: AC
Start: 2022-02-13 — End: ?
  Filled 2022-02-13: qty 30, 30d supply, fill #0

## 2022-02-17 MED ORDER — azelastine (Astelin) 137 mcg (0.1 %) nasal spray
137 | Freq: Two times a day (BID) | NASAL | 3 refills | Status: AC
Start: 2022-02-17 — End: ?
  Filled 2022-02-17: qty 30, 25d supply, fill #0

## 2022-03-21 MED ORDER — gabapentin (Neurontin) 100 mg capsule
100 | ORAL_CAPSULE | ORAL | 2 refills | Status: AC
Start: 2022-03-21 — End: ?
  Filled 2022-03-21: qty 60, 30d supply, fill #0

## 2022-03-21 MED FILL — MELOXICAM 15 MG TABLET: 15 15 mg | ORAL | 30 days supply | Qty: 30 | Fill #1 | Status: CP

## 2022-03-24 MED ORDER — lisdexamfetamine (Vyvanse) 30 mg capsule
30 | ORAL_CAPSULE | Freq: Every morning | ORAL | 0 refills | Status: DC
Start: 2022-03-24 — End: 2022-04-03
  Filled 2022-03-24: qty 30, 30d supply, fill #0

## 2022-03-24 MED ORDER — ibuprofen 800 mg tablet
800 | ORAL_TABLET | ORAL | 0 refills | Status: AC
Start: 2022-03-24 — End: 2022-04-24
  Filled 2022-03-24: qty 90, 30d supply, fill #0

## 2022-03-24 NOTE — Telephone Encounter (Signed)
Last CPE 09/02/21 with labs.   KP Patient.

## 2022-03-27 ENCOUNTER — Institutional Professional Consult (permissible substitution): Admit: 2022-03-27 | Primary: Family Medicine

## 2022-03-27 DIAGNOSIS — M7711 Lateral epicondylitis, right elbow: Secondary | ICD-10-CM

## 2022-03-27 MED ORDER — meloxicam (Mobic) 15 mg tablet
15 | ORAL_TABLET | ORAL | 2 refills | Status: AC
Start: 2022-03-27 — End: ?

## 2022-03-27 MED ORDER — naproxen (Naprosyn) 500 mg tablet
500 | ORAL_TABLET | ORAL | 0 refills | 29.00000 days | Status: DC
Start: 2022-03-27 — End: 2022-04-15
  Filled 2022-03-27: qty 60, 30d supply, fill #0

## 2022-03-27 NOTE — Patient Instructions (Signed)
Department Of Ortho and Sports Medicine    Taking Care of Your Cast or Splint  Your doctor will explain any restrictions on using your injured arm or leg while it is healing. You must follow your doctor's instructions carefully to make sure your bone heals properly. The following information provides general guidelines only and is not a substitute for your doctor's advice.    After you have adjusted to your cast or splint for a few days, it is important to keep it in good condition. This will help your recovery.    DO:  Keep your cast or splint dry. Moisture weakens plaster and damp padding next to the skin can cause irritation. Use two layers of plastic or purchase waterproof shields to keep your cast or splint dry while you shower or bathe. Even if the cast is covered , do not submerge it or hold it under running water. A small pinhole in the cast cover can cause the injury to get soaked.  AVOID dirt. Keep dirt, sand, and powder away from the inside  of your cast or splint.  Inspect the skin around the cast. If your skin becomes red or raw around the cast, contact your doctor.  Inspect the cast regularly. If it becomes cracked or develops soft spots, contact your doctor.    DO NOT:  Walking casts: DO NOT walk on a "walking cast" until it is completely dry and hard. It takes about one(1) hour for fiberglass and two(2) to three(3) days for plaster to become hard enough to walk on. You will be given a "cast shoe" to wear over your walking cast. The cast shoe will help protect the bottom of the cast.  Padding: DO NOT pull out the padding from your cast or splint.  Itching: DO NOT stick objects such as coat hangers inside the cast or splint to scratch your skin. If something gets stuck inside your cast or splint it may irritate your skin. If itching persists, contact your doctor.    Use common sense. You have a serious injury and you must protect your cast or splint from damage so it can protect your injury while  it heals.    After the initial swelling has subsided, proper cast or splint support will usually allow you to continue your daily activities with a minimum inconvenience.

## 2022-03-27 NOTE — Progress Notes (Signed)
Patient ID: [name] is a 46 y.o. female.    Casting   Date/Time: 03/27/2022 11:30 AM   Performed by: Chipper Herb   Authorized by: Margaree Mackintosh, MD   Consent given by: patient  Injury  Location details: right elbow  Pre-procedure assessment  neurovascularly intact  Procedure  Immobilization: cast  Cast type: long arm  Supplies used: Fiberglass and cotton padding  Post-procedure assessment  neurovascularly intact  Patient tolerance: patient tolerated the procedure well with no immediate complications

## 2022-04-04 MED ORDER — lisdexamfetamine (Vyvanse) 30 mg capsule
30 | ORAL_CAPSULE | Freq: Every morning | ORAL | 0 refills | Status: DC
Start: 2022-04-04 — End: 2022-06-19
  Filled 2022-04-22: qty 30, 30d supply, fill #0

## 2022-04-07 ENCOUNTER — Institutional Professional Consult (permissible substitution): Admit: 2022-04-07 | Primary: Family Medicine

## 2022-04-07 DIAGNOSIS — M7711 Lateral epicondylitis, right elbow: Secondary | ICD-10-CM

## 2022-04-07 NOTE — Progress Notes (Signed)
Rt elbow lateral epicontylitis  LAC Rt removed, skin intact  New LAC Rt applied  Pt advised on cast care,elevation of extremity,ROM and circulation precautions    Patient ID: [name] is a 46 y.o. female.    Casting   Date/Time: 04/07/2022 2:16 PM   Performed by: Layth Cerezo   Authorized by: Margaree MackintoshElisabeth Gennis, MD   Consent given by: patient  Injury  Location details: right elbow  Pre-procedure assessment  Range of motion: reduced    Procedure  Immobilization: cast  Cast type: long arm  Modifications: cast removal   Supplies used: cotton padding and Fiberglass  Post-procedure assessment  Patient tolerance: patient tolerated the procedure well with no immediate complications

## 2022-04-11 ENCOUNTER — Ambulatory Visit: Primary: Family Medicine

## 2022-04-15 ENCOUNTER — Institutional Professional Consult (permissible substitution): Admit: 2022-04-15 | Primary: Family Medicine

## 2022-04-15 DIAGNOSIS — M7711 Lateral epicondylitis, right elbow: Secondary | ICD-10-CM

## 2022-04-15 MED ORDER — naproxen (Naprosyn) 500 mg tablet
500 | ORAL_TABLET | ORAL | 0 refills | 29.00000 days | Status: AC
Start: 2022-04-15 — End: ?
  Filled 2022-04-19: qty 60, 30d supply, fill #0

## 2022-04-15 NOTE — Progress Notes (Signed)
Patient ID: [name] is a 46 y.o. female.    Ortho Tech Note    Pt referred to Ortho by Dr  Delories HeinzGennis s/p Rt humerus fx.  Cast removed, skin intact. Sling applied Pt to Dr Delories HeinzGennis' office for f/u appt.      Casting   Date/Time: 04/15/2022 11:13 AM   Performed by: Merdis DelayJohn Kyeshia Zinn   Authorized by: Margaree MackintoshElisabeth Gennis, MD   Consent given by: patient  Injury  Location details: right elbow  Pre-procedure assessment  neurovascularly intact  Range of motion: reduced    Procedure  Manipulation performed? no manipulation performed  Modifications: cast removal   Post-procedure assessment  neurovascularly intact  Range of motion: unchanged  Patient tolerance: patient tolerated the procedure well with no immediate complications

## 2022-04-21 MED FILL — PANTOPRAZOLE 40 MG TABLET,DELAYED RELEASE: 40 40 mg | ORAL | 90 days supply | Qty: 90 | Fill #1 | Status: CP

## 2022-04-24 MED ORDER — albuterol 90 mcg/actuation inhaler
90 | Freq: Four times a day (QID) | RESPIRATORY_TRACT | 0 refills | Status: AC | PRN
Start: 2022-04-24 — End: 2022-05-24
  Filled 2022-04-24: qty 8.5, 25d supply, fill #0

## 2022-04-24 NOTE — Patient Instructions (Signed)
Rx sent

## 2022-04-28 NOTE — Patient Instructions (Signed)
DONE.

## 2022-05-08 MED FILL — VENLAFAXINE ER 150 MG CAPSULE,EXTENDED RELEASE 24 HR: 150 150 mg | ORAL | 90 days supply | Qty: 90 | Fill #1 | Status: CP

## 2022-06-20 MED ORDER — lisdexamfetamine (Vyvanse) 30 mg capsule
30 | ORAL_CAPSULE | Freq: Every morning | ORAL | 0 refills | Status: DC
Start: 2022-06-20 — End: 2022-07-15
  Filled 2022-06-20: qty 30, 30d supply, fill #0

## 2022-06-20 NOTE — Telephone Encounter (Signed)
Last seen 09/02/21  Next appt 09/03/22   pmp 04/22/22  UDS no info

## 2022-07-15 MED ORDER — lisdexamfetamine (Vyvanse) 30 mg capsule
30 | ORAL_CAPSULE | Freq: Every morning | ORAL | 0 refills | Status: AC
Start: 2022-07-15 — End: 2022-09-06
  Filled 2022-08-06: qty 30, 30d supply, fill #0

## 2022-07-15 MED FILL — LISDEXAMFETAMINE 30 MG CAPSULE: 30 30 mg | ORAL | 30 days supply | Qty: 30 | Fill #0 | Status: CN

## 2022-07-15 NOTE — Telephone Encounter (Signed)
Last seen 09/02/21  Next appt 09/03/22   pmp 06/20/22   UDS no info

## 2022-08-04 ENCOUNTER — Inpatient Hospital Stay: Admit: 2022-08-04 | Discharge: 2022-08-04 | Payer: BLUE CROSS/BLUE SHIELD | Primary: Family Medicine

## 2022-08-04 DIAGNOSIS — K224 Dyskinesia of esophagus: Secondary | ICD-10-CM

## 2022-08-04 DIAGNOSIS — K317 Polyp of stomach and duodenum: Secondary | ICD-10-CM

## 2022-08-04 LAB — POCT PREGNANCY, URINE: POC hCG Qual, Ur: NEGATIVE

## 2022-08-04 MED ORDER — onabotulinumtoxinA (Botox) injection 100 Units
100 | Freq: Once | INTRAMUSCULAR | Status: AC
Start: 2022-08-04 — End: 2022-08-04
  Administered 2022-08-04: 19:00:00 100 [IU] via INTRAMUSCULAR

## 2022-08-04 MED ORDER — ondansetron (Zofran) 4 mg/2 mL injection  - Omnicell Override Pull
4 | INTRAMUSCULAR | Status: AC
Start: 2022-08-04 — End: ?

## 2022-08-04 MED ORDER — propofol (Diprivan) infusion 10 mg/mL
10 | INTRAVENOUS | Status: DC | PRN
Start: 2022-08-04 — End: 2022-08-04
  Administered 2022-08-04 (×2): 200 via INTRAVENOUS
  Administered 2022-08-04: 19:00:00 100 via INTRAVENOUS

## 2022-08-04 MED ORDER — lactated Ringer's infusion
INTRAVENOUS | Status: DC
Start: 2022-08-04 — End: 2022-08-05
  Administered 2022-08-04: 18:00:00 50 mL/h via INTRAVENOUS

## 2022-08-04 MED ORDER — propofol (Diprivan) 10 mg/mL injection  - Omnicell Override Pull
10 | INTRAVENOUS | Status: AC
Start: 2022-08-04 — End: ?

## 2022-08-04 MED ORDER — ondansetron (Zofran) injection 8 mg
4 | Freq: Once | INTRAMUSCULAR | Status: AC
Start: 2022-08-04 — End: 2022-08-04
  Administered 2022-08-04: 20:00:00 8 mg via INTRAVENOUS

## 2022-08-04 MED FILL — ONABOTULINUMTOXINA 100 UNIT SOLUTION FOR INJECTION: 100 100 unit | INTRAMUSCULAR | Qty: 100

## 2022-08-04 MED FILL — PROPOFOL 10 MG/ML INTRAVENOUS EMULSION: 10 10 mg/mL | INTRAVENOUS | Qty: 50

## 2022-08-04 MED FILL — ONDANSETRON HCL (PF) 4 MG/2 ML INJECTION SOLUTION: 4 4 mg/2 mL | INTRAMUSCULAR | Qty: 4

## 2022-08-04 NOTE — H&P (Signed)
GI Endoscopy Short H&P:      History of Present Illness:  [name] is a 47 y.o. female here for upper GI endoscopy. She has a hx of DES with chest pain and nausea predominant symptoms. No significant dysphagia. Also with reflux symptoms. Was on PPI and gabapentin. The latter she weaned off and stopped for now. Last EGD with distal and mid/distal esoph botox injection with improvement in symptoms albeit of limited duration.    Review of Systems:  10-point review of systems negative unless as stated above.    Medications  (Not in a hospital admission)    Medications      Start Medication Dose/Rate, Route, Frequency Ordered Stop    08/04/22 1330 lactated Ringer's infusion         50 mL/hr, IV, Continuous 08/04/22 1304 --    08/04/22 1300 onabotulinumtoxinA (Botox) injection 100 Units        Note to Pharmacy: Botox injection 100 units (2 orders for 2 boxes) for endoscopic injection during procedure    100 Units, IM, Once 08/04/22 1245 --    08/04/22 1300 onabotulinumtoxinA (Botox) injection 100 Units        Note to Pharmacy: Botox injection 100 units (2 orders for 2 boxes) for endoscopic injection during procedure    100 Units, IM, Once 08/04/22 1245 --                 Past Medical History  Past Medical History:   Diagnosis Date   . Anemia     with pregnancy   . Anxiety    . Asthma ([place])    . GERD (gastroesophageal reflux disease)    . Migraines        Allergies  Allergies   Allergen Reactions   . Codeine      Other reaction(s): Vomiting  sensitivity         Surgical History  Past Surgical History:   Procedure Laterality Date   . ESOPHAGOGASTRODUODENOSCOPY  2022   . LASIK  2005   . TONSILLECTOMY  2002        Family History  Family History   Problem Relation Name Age of Onset   . Hypertension Mother     . Diabetes Mother     . Hypothyroidism Mother     . Other (renal cell cancer) Mother     . Other (orthostatic tremor) Mother     . Heart failure Mother     . Anxiety disorder Father     . Basal cell carcinoma  Father     . Depression Father     . Cardiomyopathy Father     . Heart disease Father     . Drug abuse Brother     . Heart attack Maternal Grandmother         Social History  Social History     Social History Narrative    Safe at home           Objective     Vitals:  No data recorded        Physical Exam:   General: Alert and oriented, in no acute distress  HEENT: Sclera non-icteric; head atraumatic  Respiratory: Breathing comfortably on room air  Cardiovascular: Regular rate and rhythm  Abdomen: Soft and non-tender to palpation  Extremities: Warm and well perfused  Skin: No jaundice  Neuro: Alert and oriented    Lab Results:    Hospital Outpatient Visit on 08/04/2022  Component Date Value Ref Range Status   . POC hCG Qual, Ur 08/04/2022 Negative   Final       Radiology:        Assessment/Plan   Assessment and Plan:   Patient presenting for upper GI endoscopy for esophageal botox injection for DES with recurrent chest pain and nausea.  Risks/benefits discussed, consent signed, proceed today with EGD with botox injection

## 2022-08-04 NOTE — Other (Signed)
Patient Education  Table of Contents   Moderate Conscious Sedation, Adult, Care After   Upper Endoscopy, Adult, Care After    To view videos and all your education online visit,  [email or web address]  or scan this QR code with your smartphone.  Access to this content will expire in one year.  Moderate Conscious Sedation, Adult, Care After  After the procedure, it is common to have:   Sleepiness for a few hours.   Impaired judgment for a few hours.   Trouble with balance.   Nausea or vomiting if you eat too soon.  Follow these instructions at home:  For the time period you were told by your health care provider:     Rest.   Do not participate in activities where you could fall or become injured.   Do not drive or use machinery.   Do not drink alcohol.   Do not take sleeping pills or medicines that cause drowsiness.   Do not make important decisions or sign legal documents.   Do not take care of children on your own.  Eating and drinking   Follow instructions from your health care provider about what you may eat and drink.   Drink enough fluid to keep your urine pale yellow.   If you vomit:  ? Drink clear fluids slowly and in small amounts as you are able. Clear fluids include water, ice chips, low-calorie sports drinks, and fruit juice that has water added to it (diluted fruit juice).  ? Eat light and bland foods in small amounts as you are able. These foods include bananas, applesauce, rice, lean meats, toast, and crackers.  General instructions   Take over-the-counter and prescription medicines only as told by your health care provider.   Have a responsible adult stay with you for the time you are told.   Do not use any products that contain nicotine or tobacco. These products include cigarettes, chewing tobacco, and vaping devices, such as e-cigarettes. If you need help quitting, ask your health care provider.   Return to your normal activities as told by your health care provider. Ask your health care  provider what activities are safe for you.  Your health care provider may give you more instructions. Make sure you know what you can and cannot do.  Contact a health care provider if:   You are still sleepy or having trouble with balance after 24 hours.   You feel light-headed.   You vomit every time you eat or drink.   You get a rash.   You have a fever.   You have redness or swelling around the IV site.  Get help right away if:   You have trouble breathing.   You start to feel confused at home.  These symptoms may be an emergency. Get help right away. Call 911.   Do not wait to see if the symptoms will go away.   Do not drive yourself to the hospital.  This information is not intended to replace advice given to you by your health care provider. Make sure you discuss any questions you have with your health care provider.  Document Released: 2013-04-13 Document Updated: 2022-01-06 Document Reviewed: 2022-01-06  Elsevier Patient Education ? [place]  Upper Endoscopy, Adult, Care After  After the procedure, it is common to have a sore throat. It is also common to have:   Mild stomach pain or discomfort.   Bloating.   Nausea.  Follow these instructions at  home:    The instructions below may help you care for yourself at home. Your health care provider may give you more instructions. If you have questions, ask your health care provider.   If you were given a sedative during the procedure, it can affect you for several hours. Do not drive or operate machinery until your health care provider says that it is safe.   If you will be going home right after the procedure, plan to have a responsible adult:  ? Take you home from the hospital or clinic. You will not be allowed to drive.  ? Care for you for the time you are told.   Follow instructions from your health care provider about what you may eat and drink.   Return to your normal activities as told by your health care provider. Ask your health care provider  what activities are safe for you.   Take over-the-counter and prescription medicines only as told by your health care provider.  Contact a health care provider if you:   Have a sore throat that lasts longer than one day.   Have trouble swallowing.   Have a fever.  Get help right away if you:   Vomit blood or your vomit looks like coffee grounds.   Have bloody, black, or tarry stools.   Have a very bad sore throat or you cannot swallow.   Have difficulty breathing or very bad pain in your chest or abdomen.  These symptoms may be an emergency. Get help right away. Call 911.   Do not wait to see if the symptoms will go away.   Do not drive yourself to the hospital.  Summary   After the procedure, it is common to have a sore throat, mild stomach discomfort, bloating, and nausea.   If you were given a sedative during the procedure, it can affect you for several hours. Do not drive until your health care provider says that it is safe.   Follow instructions from your health care provider about what you may eat and drink.   Return to your normal activities as told by your health care provider.  This information is not intended to replace advice given to you by your health care provider. Make sure you discuss any questions you have with your health care provider.  Document Released: 2011-12-23 Document Updated: 2021-10-02 Document Reviewed: 2021-10-02  Elsevier Patient Education ? [place].

## 2022-08-04 NOTE — Op Note (Signed)
Pt states having nausea post procedure . MD Dwarakanath aware . 8mg  zofran IV ordered and administered per MAR.  Given to patient with good effect . Pt tolerated well , ambulatory to front of hospital with steady gait and family member.

## 2022-08-04 NOTE — Anesthesia Post-Procedure Evaluation (Signed)
Patient: [name]    Procedure Summary     Date: 08/04/22 Room / Location: [facility]    Anesthesia Start: 8638 Anesthesia Stop: 1400    Procedure: EGD Diagnosis:       Dyskinesia of esophagus      (Therapeutic procedure)      (For Botox injection for diffuse esophageal spasm (jackhammer esophagus) with recurrent chest pain and nausea)    Scheduled Providers: Tollie Pizza, MD; Conard Novak, MD; Milinda Antis, CRNA; Levan Hurst, RN Responsible Provider: Conard Novak, MD    Anesthesia Type: MAC ASA Status: 2          Anesthesia Type: MAC    Vitals Value Taken Time   BP 110/70 08/04/22 1432   Temp 98 08/04/22 1452   Pulse 65 08/04/22 1432   Resp 18 08/04/22 1432   SpO2 99 % 08/04/22 1432       Anesthesia Post Evaluation Note:    Patient location during evaluation: Endo Recovery  Patient participation: able to participate  Level of consciousness: awake  Cardiovascular and Hydration status: vital signs within acceptable range  Respiratory Status Stable and Airway Patent: yes       Aldrete score reviewed: yes  Vitals reviewed: yes        No notable events documented.

## 2022-08-04 NOTE — Anesthesia Pre-Procedure Evaluation (Signed)
Patient: [name]    Procedure Information     Date/Time: 08/04/22 1400    Scheduled providers: Tollie Pizza, MD; Conard Novak, MD; Milinda Antis, CRNA; Levan Hurst, RN    Procedure: EGD    Location: [facility]          Relevant Problems   Pulmonary   (+) Exercise-induced asthma (HHS-HCC)      GI   (+) GERD (gastroesophageal reflux disease)      Neuro/Psych   (+) Anxiety   (+) Depression with anxiety   (+) Migraine headache      Other   (+) Piriformis syndrome of right side       Clinical information reviewed:    Allergies  Meds     OB Status            Physical Exam    Airway  Mallampati: II  TM distance: >3 FB  Mouth opening: >3 FB  Neck ROM: full  Able to protrude mandible  No external dysmorphic features noted       Cardiovascular - normal exam     Dental - normal exam     Pulmonary - normal exam     Abdominal - normal exam     General   Alert                 Anesthesia Plan    ASA 2   NPO status verified    MAC     Airway: natural airway  Monitoring: standard monitors      Anesthetic plan and risks discussed with patient.  Preprocedure Evaluation: No items outstanding.    Complex patient: NoNo Beta Blocker given in last 24 hours

## 2022-08-05 ENCOUNTER — Encounter

## 2022-08-05 ENCOUNTER — Ambulatory Visit

## 2022-08-06 MED ORDER — pantoprazole (ProtoNix) 40 mg EC tablet
40 | ORAL_TABLET | ORAL | 3 refills | Status: AC
Start: 2022-08-06 — End: ?
  Filled 2022-08-06: qty 90, 90d supply, fill #0

## 2022-08-06 MED FILL — VENLAFAXINE ER 150 MG CAPSULE,EXTENDED RELEASE 24 HR: 150 150 mg | ORAL | 90 days supply | Qty: 90 | Fill #2 | Status: CP

## 2022-09-02 MED ORDER — gabapentin (Neurontin) 100 mg capsule
100 | ORAL_CAPSULE | ORAL | 3 refills | Status: AC
Start: 2022-09-02 — End: ?
  Filled 2022-09-02: qty 90, 30d supply, fill #0

## 2022-09-03 ENCOUNTER — Encounter

## 2022-09-03 ENCOUNTER — Encounter (INDEPENDENT_AMBULATORY_CARE_PROVIDER_SITE_OTHER)

## 2022-09-03 DIAGNOSIS — Z0000 Encounter for general adult medical examination without abnormal findings: Secondary | ICD-10-CM

## 2022-09-03 NOTE — Progress Notes (Deleted)
[facility]  [address]  [place] [address]  [phone]    Patient ID: [name] is a 47 y.o. female who presents for her annual exam    Concerns today:   No chief complaint on file.  She is doing well but she lost another sister at age 33 from lung cancer.     Recent Consults and Problems:    07/2022 EGD for chest pain, nausea and reflux. Giving botox injections for dyskinesia of esophagus, Dr. Lelon Huh   04/2022 Dr. Gaylyn Lambert right lateral epicondylitis, had been casted   05/2022 psychotherapy for anxiety, Viona Gilmore      Screening Tests:  . PAP: 04/2018 NILM, negative HPV, due 04/2023 now or later  . Mammogram: 07/2021 [facility]  . Colonoscopy: average risk, start age 8  . Bone Density: average risk, start age 70  . LDCT: N/A, never smoker    Immunizations:    [employer] x 5 most recent 04/2022   Hep B x 3   Flu 04/15/2022   Tdap 2018    ROS:   . Skin care: nothing new  . Vision: Lasik, hasn't seen eye specialist, advised her to do so  . Dental care: UTD, flosses  . Periods: had IUD x 16 years; no periods, would like hormone testing, bloated, emotional hot flashes  . Sexual activity: with husband, no issues  . Contraception: Skyla 2021 or 2022  . All other systems negative except as documented above.     Social History:  reports that she has never smoked. She has never used smokeless tobacco. She reports current alcohol use. She reports that she does not currently use drugs after having used the following drugs: Marijuana.     . Diet: healthy for the most part  . Exercise: nothing, back is still a problem, piriformis is better but now she has degenerative changes; seeing [employer], chiropractor.   . Alcohol: minimal  . Drugs: THC gummies couple times a week, no ativan  . Tobacco: never  . Occupation: back at labor and delivery at [employer]  . Household/Family: husband Legrand Como, 3 kids Orvis Brill, Rolan Bucco      Family History: family history includes Anxiety disorder in her  father; Basal cell carcinoma in her father; Cardiomyopathy in her father; Depression in her father; Diabetes in her mother; Drug abuse in her brother; Heart attack in her maternal grandmother; Heart disease in her father; Heart failure in her mother; Hypertension in her mother; Hypothyroidism in her mother; orthostatic tremor in her mother; renal cell cancer in her mother.     . Recent changes in Family History: sister died of lung cancer last week    Medications:    Current Outpatient Medications   Medication Instructions   . albuterol 90 mcg/actuation inhaler INHALE 2 PUFFS BY MOUTH EVERY 6 HOURS AS NEEDED FOR WHEEZING   . azelastine (Astelin) 137 mcg (0.1 %) nasal spray 2 sprays, Each Nostril, Every 12 hours   . fexofenadine (ALLEGRA) 180 mg, oral, Daily   . lisdexamfetamine (Vyvanse) 30 mg capsule TAKE 1 CAPSULE (30 MG) BY MOUTH IN THE MORNING.   . meloxicam (Mobic) 15 mg tablet TAKE 1 TABLET BY MOUTH EVERY DAY WITH MEALS.   . meloxicam (Mobic) 15 mg tablet TAKE 1 TABLET BY MOUTH EVERY DAY WITH MEALS   . naproxen (Naprosyn) 500 mg tablet TAKE 1 TABLET BY MOUTH TWICE A DAY AS DIRECTED   . ondansetron ODT (Zofran-ODT) 4  mg disintegrating tablet No dose, route, or frequency recorded.   . pantoprazole (ProtoNix) 40 mg EC tablet TAKE 1 TABLET BY MOUTH EVERY DAY   . pen needle, diabetic (BD Ultra-Fine Nano Pen Needle) 32 gauge x 5/32" needle Use as directed daily for injecting under the skin.   Marland Kitchen venlafaxine XR (Effexor-XR) 150 mg 24 hr capsule Take 1 capsule (150 mg) by mouth in the morning.   Mancel Parsons 0.25 mg         Past Medical History:   Diagnosis Date   . Anemia     with pregnancy   . Anxiety    . Asthma ([place])    . GERD (gastroesophageal reflux disease)    . Migraines             Objective   Visit Vitals  LMP 11/27/2021 (Approximate)   OB Status Implant     Wt Readings from Last 10 Encounters:   08/04/22 80.7 kg   01/16/22 81.6 kg   11/12/21 77.1 kg   09/30/21 81.2 kg   09/02/21 84.8 kg   04/15/21 81.2 kg    05/23/20 74.8 kg   04/10/20 82 kg   01/30/20 72.6 kg   08/12/19 77.6 kg        Physical Exam  Constitutional:       General: She is not in acute distress.     Appearance: Normal appearance.   HENT:      Right Ear: Tympanic membrane and ear canal normal.      Left Ear: Tympanic membrane and ear canal normal.      Nose: Nose normal.      Mouth/Throat:      Mouth: Mucous membranes are moist.      Pharynx: Oropharynx is clear.   Eyes:      Conjunctiva/sclera: Conjunctivae normal.   Neck:      Thyroid: No thyroid mass or thyromegaly.   Cardiovascular:      Rate and Rhythm: Normal rate and regular rhythm.      Pulses: Normal pulses.   Pulmonary:      Effort: Pulmonary effort is normal.      Breath sounds: Normal breath sounds.   Chest:   Breasts:     Breasts are symmetrical.      Right: No mass, nipple discharge or skin change.      Left: No mass, nipple discharge or skin change.   Abdominal:      General: Bowel sounds are normal.      Palpations: There is no hepatomegaly or mass.      Tenderness: There is no abdominal tenderness.   Genitourinary:     Vagina: Normal.   Musculoskeletal:      Cervical back: Neck supple.   Lymphadenopathy:      Cervical: No cervical adenopathy.      Upper Body:      Right upper body: No axillary adenopathy.      Left upper body: No axillary adenopathy.   Skin:     Findings: No rash.   Neurological:      General: No focal deficit present.      Mental Status: She is alert.   Psychiatric:         Mood and Affect: Mood normal.         Thought Content: Thought content normal.         Judgment: Judgment normal.         Assessments and Orders  Routine  health maintenance  Right lateral epicondylitis  Attention deficit disorder of adult  Obesity (BMI 30.0-34.9)  Chronic right-sided low back pain with right-sided sciatica  Depression with anxiety  Dyskinesia of esophagus  Chronic migraine without aura without status migrainosus, not intractable  Piriformis syndrome of right side  Neck  pain      ________________________________________________________    Discussion:  Routine health maintenance  Assessment & Plan:  Annual exam. Immunizations and screening tests UTD. Will check routine labs. Discussed the importance of a balanced diet and getting adequate exercise. Reviewed answers to PHQ9.  Orders:  -     CBC; Future  -     Comprehensive metabolic panel; Future  -     Hemoglobin A1c; Future  -     TSH with reflex; Future  -     Lipid panel; Future  -     Luteinizing hormone; Future  -     Follicle stimulating hormone; Future  -     Estradiol; Future  -     HLA-B27 (Disease Association); Future  -     Hepatitis C antibody; Future  Attention deficit disorder of adult  Assessment & Plan:  Having trouble getting her refills due to shortage of generic Concerta. Sent in Vyvanse which she has taken in the past.   Orders:  -     lisdexamfetamine (Vyvanse) 30 mg capsule; Take 1 capsule (30 mg) by mouth in the morning.  Obesity (BMI 30.0-34.9)  Assessment & Plan:  Eats a fairly healthy diet. Unable to exercise due to back pain.   Chronic right-sided low back pain with right-sided sciatica  Comments:  Will check an HLA-B27. She has problems at the [facility] joint  Assessment & Plan:  Seeing chiropractor for now. Her piriformis is better.   Orders:  -     celecoxib (CeleBREX) 200 mg capsule; Take 1 capsule (200 mg) by mouth in the morning.  Depression with anxiety  Assessment & Plan:  Mood is better now that she has changed jobs. Doing OK on Effexor. Getting therapy.  Gastroesophageal reflux disease without esophagitis  Assessment & Plan:  Seeing GI. EGD wasn't grossly abnormal but she is very symptomatic. Getting manometry done  Chronic migraine without aura without status migrainosus, not intractable  Assessment & Plan:  "Have been great"  Piriformis syndrome of right side  Assessment & Plan:  Improved  Neck pain  Comments:  She will work with chiro for now. Sent in Celebrex as she has bad GERD and is on a lot of  NSAIDs.   Orders:  -     celecoxib (CeleBREX) 200 mg capsule; Take 1 capsule (200 mg) by mouth in the morning.      Follow-up for annual exams or prn.

## 2022-10-29 MED ORDER — scopolamine (Transderm-Scop) 1 mg over 3 days patch 3 day
1 | MEDICATED_PATCH | TRANSDERMAL | 0 refills | 12.00000 days | Status: AC | PRN
Start: 2022-10-29 — End: 2022-12-29
  Filled 2022-10-30: qty 20, 60d supply, fill #0

## 2022-10-29 NOTE — Telephone Encounter (Signed)
Patient verbally requesting scopolamine patches, which were prescribed

## 2022-11-03 MED ORDER — lisdexamfetamine (Vyvanse) 30 mg capsule
30 | ORAL_CAPSULE | Freq: Every morning | ORAL | 0 refills | Status: AC
Start: 2022-11-03 — End: 2022-12-04
  Filled 2022-11-03: qty 30, 30d supply, fill #0

## 2022-11-03 MED ORDER — albuterol 90 mcg/actuation inhaler
90 | Freq: Four times a day (QID) | RESPIRATORY_TRACT | 0 refills | Status: AC | PRN
Start: 2022-11-03 — End: 2022-12-03
  Filled 2022-11-03: qty 8.5, 25d supply, fill #0

## 2022-11-03 MED FILL — AZELASTINE 137 MCG (0.1 %) NASAL SPRAY: 137 137 mcg (0.1 %) | NASAL | 25 days supply | Qty: 30 | Fill #1 | Status: CP

## 2022-11-03 MED FILL — VENLAFAXINE ER 150 MG CAPSULE,EXTENDED RELEASE 24 HR: 150 150 mg | ORAL | 90 days supply | Qty: 90 | Fill #3 | Status: CP

## 2022-11-03 MED FILL — PANTOPRAZOLE 40 MG TABLET,DELAYED RELEASE: 40 40 mg | ORAL | 90 days supply | Qty: 90 | Fill #1 | Status: CP

## 2022-11-03 NOTE — Telephone Encounter (Signed)
LF 04/24/22    Last seen 01/16/22

## 2022-11-03 NOTE — Telephone Encounter (Signed)
Last OV 01/16/22    Last CPE 09/02/21, sent message to book    PMP 08/06/22 qty 30 for 30 day supply

## 2022-12-03 ENCOUNTER — Inpatient Hospital Stay: Admit: 2022-12-03 | Payer: BLUE CROSS/BLUE SHIELD | Primary: Family Medicine

## 2022-12-03 DIAGNOSIS — M79672 Pain in left foot: Secondary | ICD-10-CM

## 2022-12-15 ENCOUNTER — Encounter: Payer: BLUE CROSS/BLUE SHIELD | Primary: Family Medicine

## 2022-12-15 NOTE — Progress Notes (Deleted)
[facility]  [address]  [place] [address]  [place] [address]  [phone]    Patient ID: [name] is a 47 y.o. female who presents for her annual exam    Concerns today:   No chief complaint on file.  She is doing well but she lost another sister at age 3 from lung cancer.     Recent Consults and Problems:   11/2022 Dr. Selena Batten, podiatry, left bunion cortisone injection  2/204 Dr. Delories Heinz acute flare of lateral epicondylitis  ? 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    Screening Tests:  PAP:   04/2018 NILM, negative HPV, due 2024 (today)  Mammogram: 07/2021 Birads 1 (no-show 07/2022)  Colonoscopy: average risk, start age 39  Bone Density: average risk, start age 81  LDCT: N/A, never smoker    Immunizations:   Research officer, political party x 5, most recent 04/2022  Hep B x 3  Flu 04/15/2022  Tdap 2018    ROS:   Skin care: nothing new  Vision: Lasik, hasn't seen eye specialist, advised her to do so  Dental care: UTD, flosses  Periods: had IUD x 16 years; no periods, would like hormone testing, bloated, emotional hot flashes  Sexual activity: with husband, no issues  Contraception: Skyla 2021 or 2022  All other systems negative except as documented above.     Social History:  reports that she has never smoked. She has never used smokeless tobacco. She reports current alcohol use. She reports that she does not currently use drugs after having used the following drugs: Marijuana.     Diet: healthy for the most part  Exercise: nothing, back is still a problem, piriformis is better but now she has degenerative changes; seeing [employer], chiropractor.   Alcohol: minimal  Drugs: THC gummies couple times a week, no ativan  Tobacco: never  Occupation: back at labor and delivery at [employer]  Household/Family: husband Casimiro Needle, 3 kids [name], [name]      Family History: family history includes Anxiety disorder in her father; Basal cell carcinoma in her father; Cardiomyopathy in her father; Depression in her father; Diabetes in her  mother; Drug abuse in her brother; Heart attack in her maternal grandmother; Heart disease in her father; Heart failure in her mother; Hypertension in her mother; Hypothyroidism in her mother; orthostatic tremor in her mother; renal cell cancer in her mother.     Recent changes in Family History: sister died of lung cancer last week    Medications:    Current Outpatient Medications   Medication Instructions    albuterol 90 mcg/actuation inhaler INHALE 2 PUFFS BY MOUTH EVERY 6 HOURS AS NEEDED FOR WHEEZING    azelastine (Astelin) 137 mcg (0.1 %) nasal spray 2 sprays, Each Nostril, Every 12 hours    fexofenadine (ALLEGRA) 180 mg, oral, Daily    gabapentin (Neurontin) 100 mg capsule TAKE 1 CAPSULE BY MOUTH 3 TIMES A DAY    lisdexamfetamine (Vyvanse) 30 mg capsule TAKE 1 CAPSULE (30 MG) BY MOUTH IN THE MORNING.    meloxicam (Mobic) 15 mg tablet TAKE 1 TABLET BY MOUTH EVERY DAY WITH MEALS.    meloxicam (Mobic) 15 mg tablet TAKE 1 TABLET BY MOUTH EVERY DAY WITH MEALS    naproxen (Naprosyn) 500 mg tablet TAKE 1 TABLET BY MOUTH TWICE A DAY AS DIRECTED    ondansetron ODT (Zofran-ODT) 4 mg disintegrating tablet No dose, route, or frequency recorded.    pantoprazole (ProtoNix) 40 mg EC tablet TAKE  1 TABLET BY MOUTH EVERY DAY    pen needle, diabetic (BD Ultra-Fine Nano Pen Needle) 32 gauge x 5/32" needle Use as directed daily for injecting under the skin.    scopolamine (Transderm-Scop) 1 mg over 3 days patch 3 day 1 patch, transdermal, Every 72 hours PRN    venlafaxine XR (Effexor-XR) 150 mg 24 hr capsule Take 1 capsule (150 mg) by mouth in the morning.    Wegovy 0.25 mg         Past Medical History:   Diagnosis Date    Anemia     with pregnancy    Anxiety     Asthma (HHS-HCC)     GERD (gastroesophageal reflux disease)     Migraines             Objective   Visit Vitals  OB Status Implant     Wt Readings from Last 10 Encounters:   08/04/22 80.7 kg   01/16/22 81.6 kg   11/12/21 77.1 kg   09/30/21 81.2 kg   09/02/21 84.8 kg    04/15/21 81.2 kg   05/23/20 74.8 kg   04/10/20 82 kg   01/30/20 72.6 kg   08/12/19 77.6 kg        Physical Exam  Constitutional:       General: She is not in acute distress.     Appearance: Normal appearance.   HENT:      Right Ear: Tympanic membrane and ear canal normal.      Left Ear: Tympanic membrane and ear canal normal.      Nose: Nose normal.      Mouth/Throat:      Mouth: Mucous membranes are moist.      Pharynx: Oropharynx is clear.   Eyes:      Conjunctiva/sclera: Conjunctivae normal.   Neck:      Thyroid: No thyroid mass or thyromegaly.   Cardiovascular:      Rate and Rhythm: Normal rate and regular rhythm.      Pulses: Normal pulses.   Pulmonary:      Effort: Pulmonary effort is normal.      Breath sounds: Normal breath sounds.   Chest:   Breasts:     Breasts are symmetrical.      Right: No mass, nipple discharge or skin change.      Left: No mass, nipple discharge or skin change.   Abdominal:      General: Bowel sounds are normal.      Palpations: There is no hepatomegaly or mass.      Tenderness: There is no abdominal tenderness.   Genitourinary:     Vagina: Normal.   Musculoskeletal:      Right shoulder: No tenderness or bony tenderness. Normal range of motion.      Cervical back: Neck supple.   Lymphadenopathy:      Cervical: No cervical adenopathy.      Upper Body:      Right upper body: No axillary adenopathy.      Left upper body: No axillary adenopathy.   Skin:     Findings: No rash.   Neurological:      General: No focal deficit present.      Mental Status: She is alert.   Psychiatric:         Mood and Affect: Mood normal.         Thought Content: Thought content normal.         Judgment: Judgment normal.  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 Immunizations and screening tests UTD. Will check routine labs. Discussed the importance of a balanced diet and getting adequate exercise. Reviewed answers to PHQ9.  Orders:  -     CBC; Future  -     Comprehensive metabolic panel; Future  -     Hemoglobin A1c; Future  -     TSH with reflex; Future  -     Lipid panel; Future  -     Luteinizing hormone; Future  -     Follicle stimulating hormone; Future  -     Estradiol; Future  -     HLA-B27 (Disease Association); Future  -     Hepatitis C antibody; Future  Attention deficit disorder of adult  Assessment & Plan:  Having trouble getting her refills due to shortage of generic cCncerta. Sent in Vyvanse which she has taken in the past.   Orders:  -     lisdexamfetamine (Vyvanse) 30 mg capsule; Take 1 capsule (30 mg) by mouth in the morning.  BMI 30.0-30.9,adult  Assessment & Plan:  Eats a fairly healthy diet. Unable to exercise due to back pain.   Chronic right-sided low back pain with right-sided sciatica  Comments:  Will check an HLA-B27. She has problems at the [facility] joint  Assessment & Plan:  Seeing chiropractor for now. Her piriformis is better.   Orders:  -     celecoxib (CeleBREX) 200 mg capsule; Take 1 capsule (200 mg) by mouth in the morning.  Depression with anxiety  Assessment & Plan:  Mood is better now that she has changed jobs. Doing OK on Effexor  Gastroesophageal reflux disease without esophagitis  Assessment & Plan:  Seeing GI. EGD wasn't grossly abnormal but she is very symptomatic. Getting manometry done  Chronic migraine without aura without status migrainosus, not intractable  Assessment & Plan:  "Have been great"  Piriformis syndrome of right side  Assessment & Plan:  Improved  Neck  pain  Comments:  She will work with chiro for now. Sent in Celebrex as she has bad GERD and is on a lot of NSAIDs.   Orders:  -     celecoxib (CeleBREX) 200 mg capsule; Take 1 capsule (200 mg) by mouth in the morning.      Follow-up for annual exams or prn.

## 2023-01-30 MED FILL — GABAPENTIN 100 MG CAPSULE: 100 100 mg | ORAL | 30 days supply | Qty: 90 | Fill #1 | Status: CP

## 2023-01-30 MED FILL — PANTOPRAZOLE 40 MG TABLET,DELAYED RELEASE: 40 40 mg | ORAL | 90 days supply | Qty: 90 | Fill #2 | Status: CP

## 2023-02-02 MED ORDER — lisdexamfetamine (Vyvanse) 30 mg capsule
30 | ORAL_CAPSULE | Freq: Every morning | ORAL | 0 refills | Status: DC
Start: 2023-02-02 — End: 2023-03-26
  Filled 2023-02-02: qty 30, 30d supply, fill #0

## 2023-02-02 NOTE — Telephone Encounter (Addendum)
lisdexamfetamine (Vyvanse) 30 mg capsule         Last Filled: 11/03/22 qt 30   PMP:11/03/22 qty 30 for 30 day supply order last checked 02/02/23 by NT     Last cpe: pt is booked for 02/12/23 with KP

## 2023-02-09 MED ORDER — albuterol 90 mcg/actuation inhaler
90 | Freq: Four times a day (QID) | RESPIRATORY_TRACT | 0 refills | Status: AC | PRN
Start: 2023-02-09 — End: 2023-03-11
  Filled 2023-02-09: qty 8.5, 25d supply, fill #0

## 2023-02-09 NOTE — Telephone Encounter (Signed)
Pcp RL

## 2023-02-09 NOTE — Telephone Encounter (Signed)
albuterol 90 mcg/actuation inhaler     Last Filled: 11/03/22 qty 8.5  Last cpe: 09/02/21 with KP

## 2023-02-12 ENCOUNTER — Ambulatory Visit: Admit: 2023-02-12 | Discharge: 2023-02-12 | Payer: BLUE CROSS/BLUE SHIELD | Primary: Family Medicine

## 2023-02-12 DIAGNOSIS — Z0000 Encounter for general adult medical examination without abnormal findings: Secondary | ICD-10-CM

## 2023-02-12 MED ORDER — venlafaxine 225 mg 24 hr tablet
225 | ORAL_TABLET | Freq: Every day | ORAL | 3 refills | Status: AC
Start: 2023-02-12 — End: 2024-02-12
  Filled 2023-02-13: qty 90, 90d supply, fill #0

## 2023-02-12 NOTE — Progress Notes (Addendum)
[facility]  [address]  [place] [address]  [place] [address]  [phone]    Patient ID: [name] is a 47 y.o. female who presents for her annual exam    Concerns today:   Would like labs for arthritis. Lots of immune issues in the family. Lupus, MS. "Everything hurts." Shoulders, back. Joints. Her piriformis syndrome is better. Hard to get out of bed in the morning. Feels older than her age.   She lost her brother from cardiac arrest, 54 year old in 10-31-2022. Was healthy Has lost 5 siblings total.    Her depression has been bad. Talking to her therapist.     Recent Consults and Problems:   11/2022 Dr. Selena Batten, podiatry, left bunion cortisone injection  08/2022 Dr. 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, had botox to the esophagus and she is much better    Screening Tests:  PAP:   04/2018 NILM, negative HPV, due 2024 (today)  Mammogram: 07/2021 Birads 1 (no-show 07/2022), she will call to schedule  Colonoscopy: average risk, start age 77, no FH colon cancer  Bone Density: average risk, start age 53  LDCT: N/A, never smoker    Immunizations:   Research officer, political party x 5, most recent 04/2022  Hep B x 3  Flu 04/15/2022  Tdap 2018    ROS:   Skin care: nothing new  Vision: Lasik, hasn't seen eye specialist, had an eye exam @ [facility]  Dental care: UTD, flosses  Periods: had IUD x 16 years; no periods, would like hormone testing, bloated, emotional hot flashes; Liletta placed 09/02/2019, good until 2029. Dr. Melvyn Neth inserted  Sexual activity: with husband, no issues  Contraception: Liletta inserted 08/2019, good through 08/2027  All other systems negative except as documented above.     Social History:  reports that she has never smoked. She has never used smokeless tobacco. She reports current alcohol use. She reports that she does not currently use drugs after having used the following drugs: Marijuana.     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     Recent changes in Family History: sister died of lung cancer last year, brother died in 26-Apr-2024from sudden cardiac arrest    Medications:    Current Outpatient Medications   Medication Instructions    albuterol 90 mcg/actuation inhaler INHALE 2 PUFFS BY MOUTH EVERY 6 HOURS AS NEEDED FOR WHEEZING    azelastine (Astelin) 137 mcg (0.1 %) nasal spray 2 sprays, Each Nostril, Every 12 hours    fexofenadine (ALLEGRA) 180 mg, oral, Daily    gabapentin (  Neurontin) 100 mg capsule TAKE 1 CAPSULE BY MOUTH 3 TIMES A DAY    lisdexamfetamine (Vyvanse) 30 mg capsule TAKE 1 CAPSULE (30 MG) BY MOUTH IN THE MORNING.    pantoprazole (ProtoNix) 40 mg EC tablet TAKE 1 TABLET BY MOUTH EVERY DAY    venlafaxine 225 mg, oral, Daily, Do not crush, chew, or split.       Past Medical History:   Diagnosis Date    Anemia     with pregnancy    Anxiety     Asthma (Multi-HCC)     GERD (gastroesophageal reflux disease)     Migraines (CMS-HCC)          Objective   Visit Vitals  Visit Vitals  BP 120/84   Pulse 95   Ht 1.562 m Comment: 61.5 in   Wt 82.1 kg Comment: 181 lb   SpO2 98%   BMI 33.65 kg/m   OB Status IUD   BSA 1.89 m        Wt Readings from Last 10 Encounters:   02/12/23 82.1 kg   08/04/22 80.7 kg   01/16/22 81.6 kg   11/12/21 77.1 kg   09/30/21 81.2 kg   09/02/21 84.8 kg   04/15/21 81.2 kg   05/23/20 74.8 kg   04/10/20 82 kg   01/30/20 72.6 kg      Physical Exam  Constitutional:       General: She is not in acute distress.     Appearance: Normal appearance.   HENT:      Right Ear: Tympanic membrane and ear canal normal.      Left Ear: Tympanic membrane and ear canal normal.      Nose: Nose normal.      Mouth/Throat:      Mouth: Mucous membranes are moist.      Pharynx: Oropharynx is clear.   Eyes:      Conjunctiva/sclera: Conjunctivae normal.   Neck:      Thyroid: No thyroid mass or thyromegaly.   Cardiovascular:      Rate and Rhythm: Normal rate and regular rhythm.      Pulses: Normal pulses.   Pulmonary:      Effort: Pulmonary effort is normal.      Breath sounds: Normal breath sounds.   Chest:   Breasts:     Breasts are symmetrical.      Right: No mass, nipple discharge or skin change.      Left: No mass, nipple discharge or skin change.   Abdominal:      General: Bowel sounds are normal.      Palpations: There is no hepatomegaly or mass.      Tenderness: There is no abdominal tenderness.   Genitourinary:     General: Normal vulva.      Pubic Area: No rash.       Labia:         Right: No rash or lesion.         Left: No rash or lesion.       Vagina: Normal.      Cervix: No cervical motion tenderness or lesion.      Uterus: Normal.       Adnexa: Right adnexa normal and left adnexa normal.        Right: No mass.          Left: No mass.        Comments: IUD string visible  Musculoskeletal:  Cervical back: Neck supple.   Lymphadenopathy:      Cervical: No cervical adenopathy.      Upper Body:      Right upper body: No axillary adenopathy.      Left upper body: No axillary adenopathy.   Skin:     Findings: No rash.   Neurological:      General: No focal deficit present.      Mental Status: She is alert.   Psychiatric:          Mood and Affect: Mood is depressed. Affect is tearful.         Speech: Speech normal.         Behavior: Behavior normal.         Thought Content: Thought content normal.         Judgment: Judgment normal.        Assessments and Orders    [name] was seen today for annual exam.  Routine health maintenance  -     Sedimentation rate, automated; Future  -     C-reactive protein; Future  -     Cyclic citrul peptide antibody, IgG; Future  -     Lyme Disease Serology w/Reflex; Future  -     CBC; Future  -     Comprehensive metabolic panel; Future  -     Hemoglobin A1c; Future  -     Lipid panel; Future  -     TSH with reflex; Future  -     Rheumatoid factor; Future  Screening for cervical cancer  -     Pap Smear + Aptima HPV w/ Rflx to HPV Genotypes 16/18, 45; Future  Attention deficit disorder of adult  -     UDS, Urine Drug Screen; Future  Obesity (BMI 30.0-34.9)  Chronic right-sided low back pain with right-sided sciatica  Depressive disorder (CMS-HCC)  -     venlafaxine 225 mg 24 hr tablet; Take 1 tablet (225 mg) by mouth once daily. Do not crush, chew, or split.  Gastroesophageal reflux disease without esophagitis  Chronic migraine without aura without status migrainosus, not intractable (CMS-HCC)  Piriformis syndrome of right side  Neck pain  Anxiety  -     venlafaxine 225 mg 24 hr tablet; Take 1 tablet (225 mg) by mouth once daily. Do not crush, chew, or split.  Family history of sudden cardiac death  -     ECG 12 lead; Future  -------------------------------------------  Discussion:    Routine health maintenance  Assessment & Plan:  Annual exam. Immunizations and screening tests UTD. Will check routine labs. PAP done today. She'll call to schedule her mammogram. Discussed the importance of a balanced diet and getting adequate exercise. Reviewed answers to PHQ9. She is quite depressed. Has had many losses in her life.   Attention deficit disorder of adult  Assessment & Plan:  Vyvanse is working well. Only takes it on  work days.   Obesity (BMI 30.0-34.9)  Assessment & Plan:  Eats a fairly healthy diet. Unable to exercise due to back pain. We discussed GLP-1. She will consider returning for a visit to start. Encouarged finding a way to get more exercise.   Chronic right-sided low back pain with right-sided sciatica  Comments:  Better now. Piriformis syndrome has resolved.   Depression with anxiety  Assessment & Plan:  Mood is worse. Her brother just passes. Has lost 5 siblings. Will increase her venlafaxine and see how she does. Still talking  to therapist. No SI.   Gastroesophageal reflux disease without esophagitis  Assessment & Plan:  Botox has really helped. She had a jackhammer esophagus. Is on Protonix now. Sees Dr. Rockwell [place].   Chronic migraine without aura without status migrainosus, not intractable  Assessment & Plan:  "Have been great"  Piriformis syndrome of right side  Assessment & Plan:  Improved  Neck pain  Comments:  She holds all her tension and stress in her neck.   Family history of sudden cardiac death  Her brother died suddenly from cardiac arrest and he was apparently healthy. Will check an EKG on [facility]. Will check her lipids. Discussed other options for determining cardiac risk including calcium CT score.     Patient Health Questionnaire-9 Score: 4   Interpretation: Negative screening.     Follow-up & Interventions: Pharmacological intervention  Generalized Anxiety Disorder screening - GAD-7 Score: 3  Interpretation: Negative screening.  Follow up & Intervention: Patient currently receiving treatment for anxiety and Pharmacological intervention      Follow-up 1 month to monitor depression. She would also like to discuss GLP-1 meds for weight loss.

## 2023-02-13 LAB — URINE DRUG SCREEN
Amphet Scr Ur: NEGATIVE ng/mL
Barbiturates screen, urine: NEGATIVE ng/mL
Benzodiazepines Screen, Ur: NEGATIVE ng/mL
Buprenorphine Screen, urine: NEGATIVE ng/mL
Cannabinoids screen, urine: POSITIVE ng/mL — AB
Cocaine screen, urine: NEGATIVE ng/mL
Creatinine, Urine: 226.1 mg/dL (ref 20.0–300.0)
Ethanol Screen, Ur: NEGATIVE %
Fentanyl Screen, Ur: NEGATIVE ng/mL
Methadone Screen, Ur: NEGATIVE ng/mL
Nitrite, Urine: NEGATIVE ug/mL
Opiates screen, urine: NEGATIVE ng/mL
Oxycodone/Oxymorphone Screen, Ur: NEGATIVE ng/mL
pH, Urine: 5.5 (ref 4.5–8.9)

## 2023-02-13 LAB — PLEASE NOTE (REFLEX ONLY)

## 2023-02-15 LAB — COMPREHENSIVE METABOLIC PANEL
ALT: 23 IU/L (ref 0–32)
AST: 22 IU/L (ref 0–40)
Albumin: 4.6 g/dL (ref 3.9–4.9)
Alk Phosphatase: 80 IU/L (ref 44–121)
Anion Gap: 15 mmol/L (ref 10.0–18.0)
BUN/Creat Ratio: 13 (ref 9–23)
BUN: 11 mg/dL (ref 6–24)
Bili Total: 0.3 mg/dL (ref 0.0–1.2)
Calcium: 9.6 mg/dL (ref 8.7–10.2)
Carbon Dioxide: 24 mmol/L (ref 20–29)
Chloride: 101 mmol/L (ref 96–106)
Creat: 0.87 mg/dL (ref 0.57–1.00)
Globulin Total: 2.4 g/dL (ref 1.5–4.5)
Glucose: 97 mg/dL (ref 70–99)
Potassium: 4 mmol/L (ref 3.5–5.2)
Protein Total: 7 g/dL (ref 6.0–8.5)
Sodium: 140 mmol/L (ref 134–144)
eGFR: 84 mL/min/{1.73_m2} (ref >59)

## 2023-02-15 LAB — LIPID PANEL
Cholesterol: 237 mg/dL — ABNORMAL HIGH (ref 100–199)
HDL Cholesterol: 42 mg/dL (ref >39)
LDLc Calc (NIH): 162 mg/dL — ABNORMAL HIGH (ref 0–99)
Non-HDL Chol: 195 mg/dL — ABNORMAL HIGH (ref 0–129)
Triglycerides: 180 mg/dL — ABNORMAL HIGH (ref 0–149)
VLDLc Calc: 33 mg/dL (ref 5–40)

## 2023-02-15 LAB — C-REACTIVE PROTEIN: C-Reactive Protein Quant: 2 mg/L (ref 0–10)

## 2023-02-15 LAB — CBC
Hct: 40.5 % (ref 34.0–46.6)
Hgb: 13.9 g/dL (ref 11.1–15.9)
MCH: 31 pg (ref 26.6–33.0)
MCHC: 34.3 g/dL (ref 31.5–35.7)
MCV: 90 fL (ref 79–97)
Platelets: 255 10*3/uL (ref 150–450)
RBC: 4.49 x10E6/uL (ref 3.77–5.28)
RDW: 12.9 % (ref 11.7–15.4)
WBC: 7.7 10*3/uL (ref 3.4–10.8)

## 2023-02-15 LAB — TSH WITH REFLEX: TSH: 4.49 u[IU]/mL (ref 0.450–4.500)

## 2023-02-15 LAB — RHEUMATOID FACTOR: RA Latex Turbid: 10.2 [IU]/mL (ref <14.0)

## 2023-02-15 LAB — SEDIMENTATION RATE, AUTOMATED: Sed Rate-Westergren: 8 mm/h (ref 0–32)

## 2023-02-16 LAB — CYCLIC CITRUL PEPTIDE: CCP IgG/IgA Abs: 16 U (ref 0–19)

## 2023-02-16 LAB — LYME DISEASE SEROLOGY W/REFLEX: Lyme Total Antibody CIA: NEGATIVE

## 2023-02-17 LAB — HEMOGLOBIN A1C: HgbA1C: 5.4 % (ref 4.8–5.6)

## 2023-02-18 LAB — PAP SMEAR + APTIMA HPV W/ RFLX TO HPV GENOTYPES 16/18, 45: HPV Aptima: NEGATIVE

## 2023-03-21 ENCOUNTER — Inpatient Hospital Stay: Admit: 2023-03-21 | Payer: BLUE CROSS/BLUE SHIELD | Primary: Family Medicine

## 2023-03-21 DIAGNOSIS — Z1231 Encounter for screening mammogram for malignant neoplasm of breast: Secondary | ICD-10-CM

## 2023-03-26 ENCOUNTER — Ambulatory Visit: Admit: 2023-03-26 | Discharge: 2023-03-26 | Payer: BLUE CROSS/BLUE SHIELD | Primary: Family Medicine

## 2023-03-26 DIAGNOSIS — E669 Obesity, unspecified: Secondary | ICD-10-CM

## 2023-03-26 MED ORDER — lisdexamfetamine (Vyvanse) 30 mg capsule
30 | ORAL_CAPSULE | Freq: Every morning | ORAL | 0 refills | Status: DC
Start: 2023-03-26 — End: 2023-05-19

## 2023-03-26 MED ORDER — ibuprofen 600 mg tablet
600 | ORAL_TABLET | Freq: Three times a day (TID) | ORAL | 0 refills | Status: AC | PRN
Start: 2023-03-26 — End: 2023-05-25
  Filled 2023-03-26: qty 90, 30d supply, fill #0

## 2023-03-26 MED ORDER — semaglutide, weight loss, (Wegovy) 0.25 mg/0.5 mL pen injector
0.25 | SUBCUTANEOUS | 3 refills | 28.00000 days | Status: DC
Start: 2023-03-26 — End: 2023-05-04

## 2023-03-26 NOTE — Progress Notes (Signed)
[facility]  [address]  [address] [address]  [place] [address]  [phone]    Patient ID: [name] is a 47 y.o. female who presents for Follow-up, Depression, and Weight Management.    Subjective   HPI    [name] is here to follow-up on her mood and also to discuss weight loss medications.     The increase in the venlafaxine has been helpful. Able to clean the house, deal more with the kids. Meanwhile her godmother just passed. She almost feels numb to it. "I never get to grieve; I'm always grieving." Working with her therapist. Seeing weekly, but will decrease to every 2 weeks mostly because of her schedule. She gets bad sweats from venlafaxine and it has increased. She doesn't know if it's hot flashes or the medication.     She is miserable at work at [employer]. Also working at [employer]. She finds it therapeutic to work there.     She wants to try weight loss GLP-1's. Keeps gaining. No FH or personal history of thyroid cancer. No MENS 2. Was approved for [facility] in the past through [facility] but never went on it.    Goal weight is 150. Is almost 190. Highest was 200.     Diet: sticks to 1500-1800 Kcals. Was doing Weight Watchers with someone at work but hasn't been able to stick to their diet. She might eat less but occasionally binges, then she eats the rest of the day. Some days doesn't eat at all. If she's anxious the food goes right through her.     Exercise: plans to get better. Has been doing more walking. Wants to commit to a 20 minutes walk at least 3 times a week.     61 year old son is obese with fatty liver and goes the [facility]. [facility] They are trying to get him approved for [facility].     Medications:  Current Outpatient Medications   Medication Instructions    albuterol 90 mcg/actuation inhaler INHALE 2 PUFFS BY MOUTH EVERY 6 HOURS AS NEEDED FOR WHEEZING    azelastine (Astelin) 137 mcg (0.1 %) nasal spray 2 sprays, Each Nostril,  Every 12 hours    fexofenadine (ALLEGRA) 180 mg, oral, Daily    gabapentin (Neurontin) 100 mg capsule TAKE 1 CAPSULE BY MOUTH 3 TIMES A DAY    lisdexamfetamine (Vyvanse) 30 mg capsule TAKE 1 CAPSULE (30 MG) BY MOUTH IN THE MORNING.    pantoprazole (ProtoNix) 40 mg EC tablet TAKE 1 TABLET BY MOUTH EVERY DAY    venlafaxine 225 mg, oral, Daily, Do not crush, chew, or split.    Wegovy 0.25 mg, subcutaneous, Every 7 days        Past Medical History:   Diagnosis Date    Anemia     with pregnancy    Anxiety     Asthma (Multi-HCC)     GERD (gastroesophageal reflux disease)     Migraines (CMS-HCC)       ROS  Objective   Visit Vitals  BP 120/84 (BP Location: Left arm, Patient Position: Sitting, BP Cuff Size: Adult)   Pulse 93   Wt 85 kg Comment: 187.4 lbs   SpO2 96%   BMI 34.84 kg/m   OB Status IUD   BSA 1.92 m       Physical Exam  Psychiatric:         Mood and Affect:  Mood and affect normal.      Comments: Much improved since last visit         Assessments and Orders  [name] was seen today for follow-up, depression and weight management.  Obesity (BMI 30.0-34.9)  -     semaglutide, weight loss, (Wegovy) 0.25 mg/0.5 mL pen injector; Inject 0.5 mL (0.25 mg) under the skin every 7 (seven) days.  Attention deficit disorder of adult  Chronic right-sided low back pain with right-sided sciatica  ________________________________________________________    Discussion:    [name] was seen today for follow-up of depression and weight management.    Obesity (BMI 30.0-34.9)  Reviewed pro's and cons including potential side effects and the fact that the effects will stop working if she stops it. She will still need to make permanent changes to her eating and exercise habits for her to maintain the weight loss. Reviewed dosing steps. As long as she continues to lose on a dose, can continue on it, but will step up if weight loss stops or if she wants to. She has no contraindications and I reviewed potential adverse effects.   Attention deficit  disorder of adult  Refilled her Vyvanse  Chronic right-sided low back pain with right-sided sciatica  She requested ibuprofen which she takes prn for her back pain. Overall she has improved a lot.     She will follow-up in 3 months.     Patient Health Questionnaire-9 Score: 9   Interpretation: Positive screening.     Follow-up & Interventions: Patient currently receiving treatment for depression    Total visit time 35 min. which includes visit preparation, review of consult notes, face to face time with patient, ordering/reviewing labs/imaging/meds and documentation of visit.

## 2023-03-30 MED FILL — LISDEXAMFETAMINE 30 MG CAPSULE: 30 30 mg | ORAL | 30 days supply | Qty: 30 | Fill #0 | Status: CP

## 2023-04-06 MED FILL — WEGOVY 0.25 MG/0.5 ML SUBCUTANEOUS PEN INJECTOR: 0.25 0.25 mg/0.5 mL | SUBCUTANEOUS | 28 days supply | Qty: 2 | Fill #0 | Status: CP

## 2023-04-14 NOTE — Patient Instructions (Signed)
Hi [name],     This just got sent to me today.     I have requested a PA for it via EPIC    I will work on it from here on out

## 2023-04-16 NOTE — Patient Instructions (Signed)
PA was done via [email or web address]    PA EOC ID [id number]

## 2023-04-20 NOTE — Patient Instructions (Signed)
Contacted patient, informed her that PA has been approved for [employer].

## 2023-04-21 MED ORDER — ondansetron (Zofran) 4 mg tablet
4 | ORAL_TABLET | Freq: Three times a day (TID) | ORAL | 0 refills | Status: DC | PRN
Start: 2023-04-21 — End: 2023-05-25
  Filled 2023-04-21: qty 6, 2d supply, fill #0

## 2023-05-04 IMAGING — MR PELVE Feminina
1 series · 17 of 17 positions shown · non-contrast
Comparison: none

[Series 1: localizer_haste · U · 2 acquisitions, 17 frames shown]
[im 1/2]
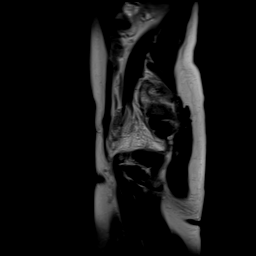
[im 1/2]
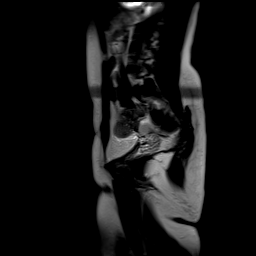
[im 1/2]
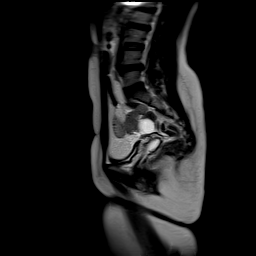
[im 1/2]
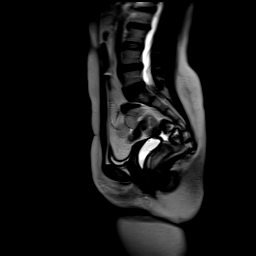
[im 1/2]
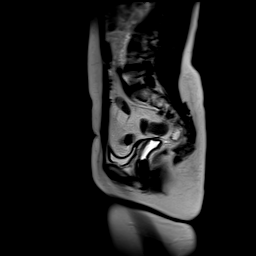
[im 1/2]
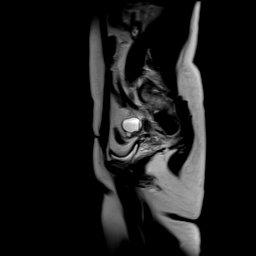
[im 1/2]
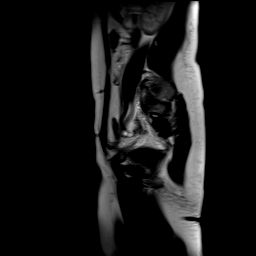
[im 2/2]
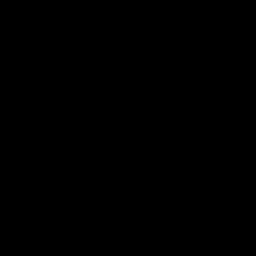
[im 2/2]
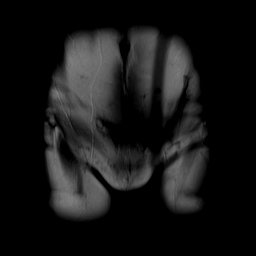
[im 2/2]
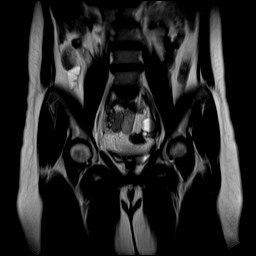
[im 2/2]
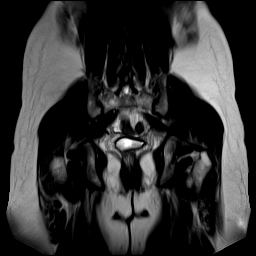
[im 2/2]
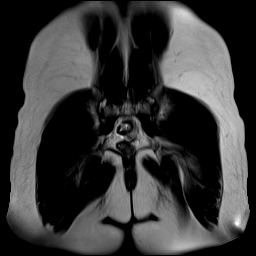
[im 2/2]
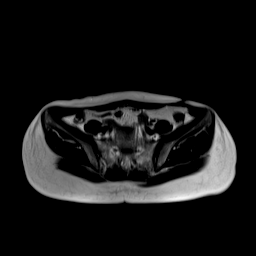
[im 2/2]
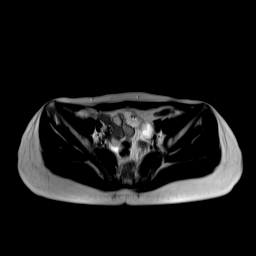
[im 2/2]
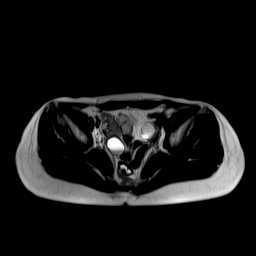
[im 2/2]
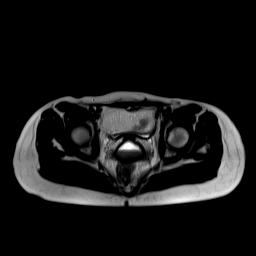
[im 2/2]
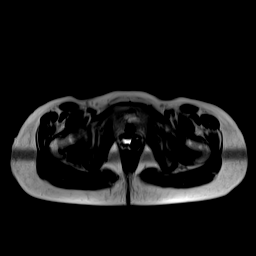

[17 of 17 positions shown; findings below may reference images not displayed]

[facility]olicitante:[facility]
Protocolo: Sequências multiplanares FSE e GRE ponderadas em T1 e T2, com a injeção endovenosa de
contraste paramagnético. Utilizado gel vaginal. 
Relatório:
Bexiga com paredes finas e conteúdo líquido homogêneo.
Útero ausente.
Ovários de dimensões e contornos preservados. Cistos simples medindo 3,2 cm a direita e 3,0 cm à
esquerda.
RESSONÂNCIA MAGNÉTICA DE PELVE 
Tecido fibrorretrátil de marcado hipossinal em T2, sem focos hemorrágicos de permeio, periovariano à
esquerda.
Ausência de linfonodomegalias ou de líquido livre na pelve.
Impressão diagnóstica:
Histerectomia.
Cistos simples ovarianos bilaterais.
Tecido fibrorretrátil de marcado hipossinal em T2, sem focos hemorrágicos de permeio, periovariano à
esquerda, de aspecto inespecífico, podendo representar alterações pós cirúrgicas, não se podendo descartar
endometriose remanescente.

## 2023-05-04 MED ORDER — semaglutide, weight loss, (Wegovy) 0.5 mg/0.5 mL pen injector
0.5 | SUBCUTANEOUS | 3 refills | 28.00000 days | Status: DC
Start: 2023-05-04 — End: 2023-06-18
  Filled 2023-05-04: qty 2, 28d supply, fill #0

## 2023-05-05 MED ORDER — gabapentin (Neurontin) 100 mg capsule
100 | ORAL_CAPSULE | ORAL | 1 refills | Status: AC
Start: 2023-05-05 — End: ?
  Filled 2023-05-05: qty 90, 30d supply, fill #0

## 2023-05-19 MED ORDER — lisdexamfetamine (Vyvanse) 30 mg capsule
30 | ORAL_CAPSULE | Freq: Every morning | ORAL | 0 refills | Status: DC
Start: 2023-05-19 — End: 2023-06-10
  Filled 2023-05-19: qty 30, 30d supply, fill #0

## 2023-05-19 MED FILL — PANTOPRAZOLE 40 MG TABLET,DELAYED RELEASE: 40 40 mg | ORAL | 90 days supply | Qty: 90 | Fill #3 | Status: CP

## 2023-05-25 MED ORDER — ondansetron (Zofran) 4 mg tablet
4 | ORAL_TABLET | Freq: Three times a day (TID) | ORAL | 0 refills | Status: DC | PRN
Start: 2023-05-25 — End: 2023-06-18
  Filled 2023-05-25: qty 6, 2d supply, fill #0

## 2023-05-25 MED FILL — VENLAFAXINE ER 225 MG TABLET,EXTENDED RELEASE 24 HR: 225 225 mg | ORAL | 90 days supply | Qty: 90 | Fill #1 | Status: CP

## 2023-05-30 MED FILL — WEGOVY 0.5 MG/0.5 ML SUBCUTANEOUS PEN INJECTOR: 0.5 0.5 mg/0.5 mL | SUBCUTANEOUS | 28 days supply | Qty: 2 | Fill #1 | Status: CP

## 2023-06-10 MED ORDER — lisdexamfetamine (Vyvanse) 30 mg capsule
30 | ORAL_CAPSULE | Freq: Every morning | ORAL | 0 refills | Status: DC
Start: 2023-06-10 — End: 2023-07-27
  Filled 2023-06-18: qty 30, 30d supply, fill #0

## 2023-06-18 ENCOUNTER — Ambulatory Visit: Admit: 2023-06-18 | Discharge: 2023-06-18 | Payer: BLUE CROSS/BLUE SHIELD | Primary: Family Medicine

## 2023-06-18 DIAGNOSIS — E66811 Obesity, class 1: Secondary | ICD-10-CM

## 2023-06-18 LAB — POCT URINE DRUG SCREEN
Amphetamine Urine Screen: NOT DETECTED
Barbiturate Urine Screen: NOT DETECTED
Benzodiazepines Urine Screen: NOT DETECTED
Buprenorphine Urine Screen: NOT DETECTED
Cannabinoids, Urine Screen: POSITIVE
Cocaine Urine Screen: NOT DETECTED
EDDP Quant, Ur: NOT DETECTED
MDMA URINE SCREEN: NOT DETECTED
Methadone Urine Screen: NOT DETECTED
Morphine Urine Screen: NOT DETECTED
Oxycodone Urine Screen: NOT DETECTED
POC Methamphetamine Urine Screen: NOT DETECTED
Phencyclidine Urine Screen: NOT DETECTED

## 2023-06-18 MED ORDER — ondansetron (Zofran) 4 mg tablet
4 | ORAL_TABLET | Freq: Three times a day (TID) | ORAL | 2 refills | Status: AC | PRN
Start: 2023-06-18 — End: ?
  Filled 2023-06-18: qty 30, 10d supply, fill #0

## 2023-06-18 MED ORDER — semaglutide, weight loss, (Wegovy) 0.5 mg/0.5 mL pen injector
0.5 | SUBCUTANEOUS | 3 refills | 28.00000 days | Status: AC
Start: 2023-06-18 — End: ?
  Filled 2023-06-19: qty 2, 28d supply, fill #0

## 2023-06-18 NOTE — Progress Notes (Signed)
[facility]  [facility]  [address]  [address]  [place] [address]  Dept: [phone]  Dept Fax: [phone]     Patient ID: [name] is a 47 y.o. female who presents for Follow-up (Pt needs to sign a med contract for on file for 2024.  Pt states that she is here today to follow up with KP from last visit on 03/23/23. Pt states her moods are well. Pt states that she thinks that her venlafaxine 225 mg 24 hr tablet is helping with her depression. Pt states that she is therapist once a week. Pt denies any thoughts of hurting herself or anyone else.  ).    Subjective   HPI    [name] is here to follow-up on her weight management. She has lost almost 20 lb on Wegovy. Has some nausea but working through it. Does yoga a couple times a week. Trying to get out and be more active. She is on the 0.5 dose. She wants to continue this dose due to the nausea.     Her therapist feels she needs something added on to the Effexor. "She says I"m still very flat." She's also tired today. She was on Celexa for 12 years and then added on Wellbutrin. Then she switched to Effexor. Sleep is not a problem. She feels that everything is coming to a head. Has been with therapist 3 years and doing a lot of good work, but still working on herself. She and husband [name] get along but haven't been close in a few years and have grown apart. She asked for a divorce. Currently relationship is cordial. Hasn't started paperwork. She feels she does everything for everyone else and not for herself.     Her kids are 21 and 18. Daughter is the older one and she wants her mom to be happy. She sees what's going on. Son has ADHD and probably Aspergers. Never formally diagnosed. He is aware of the situation but he doesn't talk about it.     She is working per diem at [employer] and now working 30 hours at [employer].     Overall regarding her depression, she feels she is getting better. Future oriented. Positive outlook. No SI.  She doesn't like taking meds. She has been through stuff and gets through it. She gets sweats from effexor.     Has jackhammer esophagus. Sees Dr. Rockwell Germany at [facility]. Now getting Botox via an EGD. She thiniks it has been helping. They did manometry.       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)    Exercise-induced asthma (Multi-HCC)    GERD (gastroesophageal reflux disease)    Migraine headache (CMS-HCC)    BMI 30.0-30.9,adult    Obesity (BMI 30.0-34.9)    Piriformis syndrome of right side    Routine health maintenance    Rash    Dyskinesia of esophagus    Neck pain    Depressive disorder (CMS-HCC)    Acute pharyngitis    Deviated nasal septum    Dysfunction of eustachian tube    Dystonia    Functional dysphagia (CMS-HCC)    Hyperlipidemia (CMS-HCC)    Indigestion    Seasonal allergies    Skin sensation disturbance  Somatic dysfunction of sacroiliac joint     Current Outpatient Medications   Medication Instructions    albuterol 90 mcg/actuation inhaler INHALE 2 PUFFS BY MOUTH EVERY 6 HOURS AS NEEDED FOR WHEEZING    azelastine (Astelin) 137 mcg (0.1 %) nasal spray 2 sprays, Each Nostril, Every 12 hours    fexofenadine (ALLEGRA) 180 mg, oral, Daily    gabapentin (Neurontin) 100 mg capsule TAKE 1 CAPSULE BY MOUTH 3 TIMES A DAY    gabapentin (Neurontin) 100 mg capsule TAKE 1 CAPSULE BY MOUTH 3 TIMES A DAY    lisdexamfetamine (Vyvanse) 30 mg capsule TAKE 1 CAPSULE (30 MG) BY MOUTH IN THE MORNING.    ondansetron (ZOFRAN) 4 mg, oral, Every 8 hours PRN    pantoprazole (ProtoNix) 40 mg EC tablet TAKE 1 TABLET BY MOUTH EVERY DAY    venlafaxine 225 mg, oral, Daily, Do not crush, chew, or split.    Wegovy 0.5 mg, subcutaneous, Every 7 days       Objective   Visit Vitals  BP 112/82   Pulse 72   Ht 1.575 m   Wt 75.8 kg   SpO2 100%   BMI 30.54 kg/m   OB Status  IUD   BSA 1.82 m       Physical Exam  Constitutional:       Appearance: Normal appearance.   HENT:      Mouth/Throat:      Mouth: Mucous membranes are moist.      Pharynx: Oropharynx is clear.   Neck:      Thyroid: No thyroid mass or thyromegaly.   Cardiovascular:      Rate and Rhythm: Normal rate and regular rhythm.      Heart sounds: Normal heart sounds.   Pulmonary:      Effort: Pulmonary effort is normal.      Breath sounds: Normal breath sounds.   Musculoskeletal:      Cervical back: Neck supple.   Lymphadenopathy:      Cervical: No cervical adenopathy.   Skin:     General: Skin is warm and dry.   Neurological:      General: No focal deficit present.      Mental Status: She is alert.   Psychiatric:         Mood and Affect: Mood is depressed (mildly).         Thought Content: Thought content normal.         Assessment/Plan   Assessment & Plan  Obesity (BMI 30.0-34.9)    Orders:    semaglutide, weight loss, (Wegovy) 0.5 mg/0.5 mL pen injector; Inject 0.5 mL (0.5 mg) under the skin every 7 (seven) days.  She has been losing weight and this does help her mood. She gets nausea so wants to stay at this dose. I sent in Zofran for her to use if needed.   Nausea    Orders:    ondansetron (Zofran) 4 mg tablet; Take 1 tablet (4 mg) by mouth every 8 (eight) hours if needed for nausea or vomiting.  As above  Depressive disorder (CMS-HCC)  At this point we will keep her on Effexor and not augment. Discussed that if she needs an additional medication would recommend she see a specialist. She agreed. She has no SI and feels she is doing better.        Perimenopause    Orders:    Estradiol; Future    FSH and LH; Future  Will recheck her labs  as she wonders if her mood is related to her hormones.   Encounter for medication management in attention deficit hyperactivity disorder (ADHD) (CMS-HCC)    Orders:    POCT Urinary Drug Test  She signed med contract and did urine drug screen.     Generalized Anxiety Disorder Screening -  GAD-7 Score: 0  Interpretation: Negative screening.  Follow up & Intervention: Maintain annual screening - No additional Follow-up required         She will follow-up in 3 months, sooner prn.     Total visit time 40 min. which includes visit preparation, review of consult notes, face to face time with patient, ordering/reviewing labs/imaging/meds and documentation of visit.

## 2023-06-18 NOTE — Assessment & Plan Note (Signed)
At this point we will keep her on Effexor and not augment. Discussed that if she needs an additional medication would recommend she see a specialist. She agreed. She has no SI and feels she is doing better.

## 2023-06-18 NOTE — Assessment & Plan Note (Signed)
Orders:    semaglutide, weight loss, (Wegovy) 0.5 mg/0.5 mL pen injector; Inject 0.5 mL (0.5 mg) under the skin every 7 (seven) days.  She has been losing weight and this does help her mood. She gets nausea so wants to stay at this dose. I sent in Zofran for her to use if needed.

## 2023-06-24 ENCOUNTER — Encounter: Payer: PRIVATE HEALTH INSURANCE | Primary: Family Medicine

## 2023-07-27 MED FILL — GABAPENTIN 100 MG CAPSULE: 100 100 mg | ORAL | 30 days supply | Qty: 90 | Fill #1 | Status: CP

## 2023-07-28 MED ORDER — lisdexamfetamine (Vyvanse) 30 mg capsule
30 | ORAL_CAPSULE | Freq: Every morning | ORAL | 0 refills | Status: AC
Start: 2023-07-28 — End: 2023-09-16
  Filled 2023-07-29: qty 30, 30d supply, fill #0

## 2023-07-28 MED ORDER — pantoprazole (ProtoNix) 40 mg EC tablet
40 | ORAL_TABLET | ORAL | 3 refills | Status: AC
Start: 2023-07-28 — End: ?
  Filled 2023-07-28: qty 90, 90d supply, fill #0

## 2023-07-28 MED ORDER — albuterol 90 mcg/actuation inhaler
90 | Freq: Four times a day (QID) | RESPIRATORY_TRACT | 0 refills | Status: AC | PRN
Start: 2023-07-28 — End: 2023-09-16
  Filled 2023-07-28: qty 8.5, 25d supply, fill #0

## 2023-08-03 NOTE — Telephone Encounter (Signed)
I called [facility] and spoke with [name] who was able to determine a PA was NOT needed but that the pt would have to pay $1200 out of pocket due to a deductible. I called [name] and notified her, she used to have [name] insurance but switched at the beginning of the month because of increasing premiums. She is not sure what her pharmacy benefits are exactly but does report she has picked up medications that were only a $35 copay so it is likely due to being a GLP. [name] is open to a different medication if [name] is inclined to order something else.

## 2023-08-21 MED ORDER — phentermine 37.5 mg capsule
37.5 | ORAL_CAPSULE | Freq: Every day | ORAL | 2 refills | Status: AC
Start: 2023-08-21 — End: 2023-11-26
  Filled 2023-08-21: qty 30, 30d supply, fill #0

## 2023-08-21 NOTE — Telephone Encounter (Signed)
PA sent via EPIC, questions answered.   Patient has [name] [facility],   See media 07/27/23

## 2023-08-21 NOTE — Patient Instructions (Signed)
Spoke with patient, confirmed DOB  Patient is out of wegovy.   Would like to try the Phentermine while waiting to see if insurance will cover [facility] with a PA that was sent today.   Advised patient to call her insurance company to check if they will cover any other GLP1 medications, Zepbound  Patient will call and send portal message with answer.

## 2023-08-24 MED FILL — VENLAFAXINE ER 225 MG TABLET,EXTENDED RELEASE 24 HR: 225 225 mg | ORAL | 90 days supply | Qty: 90 | Fill #2 | Status: CP

## 2023-09-07 NOTE — Patient Instructions (Signed)
 Spoke with patient, confirmed full name and DOB  Patient has not started the Phentermine yet, she does not feel an appeal for the [facility] will lower her cost, and will just take the Phentermine.   Reviewed message sent to her on 08/21/23 from KP, patient aware to monitor BP and HR, as well as increased cns stimulatory effects, attention, focus and alertness while taking the phentermine + lisdexamfetamine   Patient will be starting the Phentermine this month along with diet changes and exercise.

## 2023-09-16 ENCOUNTER — Ambulatory Visit: Admit: 2023-09-16 | Discharge: 2023-09-16 | Payer: PRIVATE HEALTH INSURANCE | Primary: Family Medicine

## 2023-09-16 DIAGNOSIS — N898 Other specified noninflammatory disorders of vagina: Secondary | ICD-10-CM

## 2023-09-16 NOTE — Assessment & Plan Note (Signed)
 She has done a great job losing weight. Has lost more than 30 lb. She is now more active and has adjusted her diet. She will continue. She has phentermine on hand but hasn't started it.

## 2023-09-16 NOTE — Assessment & Plan Note (Signed)
 She is on Effexor. Doesn't want to add another med. Working with therapist on coping methods. Has a lot of life stressors but she is coping well.

## 2023-09-16 NOTE — Progress Notes (Signed)
 [facility]  [facility]  [address]  [address]  [place] [address]  Dept: [phone]  Dept Fax: [phone]     Patient ID: [name] is a 48 y.o. female who presents for Follow-up (58m follow up- no concerns. ).    Subjective   HPI    [name] is here today for a 3 month follow-up.     She is going to therapy weekly and therapist wants her to ask me about anxiety meds. Oluwasemilore doesn't want meds. Trying to learn coping mechanisms. Getting outside and being more active. Feels well. Husband and she are divorcing. Still cordial and getting along.     Has lost 30 lb. Was on [facility] October through December, then insurance changed and they don't cover GLP's. Has a $500 copay monthly. She never took phentermine. Not sure she needs it anymore. Is doing well on her own. She is watching her diet and feels good. Doesn't go to the gym. She does yoga.     She didn't do hormonal labs because worried insurance won't cover. Getting a lot of vaginal dryness. Has an IUD, Skyla.          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    Exercise-induced asthma (Multi-HCC)    GERD (gastroesophageal reflux disease)    Migraine headache    BMI 30.0-30.9,adult    Overweight (BMI 25.0-29.9)    Piriformis syndrome of right side    Routine health maintenance    Rash    Dyskinesia of esophagus    Neck pain    Depressive disorder    Acute pharyngitis    Deviated nasal septum    Dysfunction of eustachian tube    Dystonia    Functional dysphagia    Hyperlipidemia    Indigestion    Seasonal allergies    Skin sensation disturbance    Somatic dysfunction of sacroiliac joint     Current Outpatient Medications   Medication Instructions    albuterol 90 mcg/actuation inhaler INHALE 2 PUFFS BY MOUTH EVERY 6 HOURS AS NEEDED FOR WHEEZING    azelastine (Astelin) 137 mcg (0.1 %) nasal  spray 2 sprays, Each Nostril, Every 12 hours    fexofenadine (ALLEGRA) 180 mg, oral, Daily    gabapentin (Neurontin) 100 mg capsule TAKE 1 CAPSULE BY MOUTH 3 TIMES A DAY    lisdexamfetamine (Vyvanse) 30 mg capsule TAKE 1 CAPSULE (30 MG) BY MOUTH IN THE MORNING.    ondansetron (ZOFRAN) 4 mg, oral, Every 8 hours PRN    pantoprazole (ProtoNix) 40 mg EC tablet TAKE 1 TABLET BY MOUTH EVERY DAY    phentermine 37.5 mg, oral, Daily before breakfast    venlafaxine 225 mg, oral, Daily, Do not crush, chew, or split.    Wegovy 0.5 mg, subcutaneous, Every 7 days       Objective   Visit Vitals  BP 104/80   Pulse 104   Wt 70.8 kg Comment: 154 lb   SpO2 97%   BMI 28.53 kg/m   OB Status IUD   BSA 1.76 m       Physical Exam  Vitals reviewed.   Constitutional:       Appearance: Normal appearance.   Neurological:  Mental Status: She is alert.       Assessment / Plan  Assessment & Plan  Vaginal dryness  She has a Skyla IUD so she may be low in estrogen. Will check labs, although discussed that her labs will be affected by her IUD. Could consider topical estrogen.     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 Doesn't want to add another med. Working with therapist on coping methods. Has a lot of life stressors but she is coping well.        Overweight (BMI 25.0-29.9)  She has done a great job losing weight. Has lost more than 30 lb. She is now more active and has adjusted her diet. She will continue. She has phentermine on hand but hasn't started it.       She will follow-up in 3 months.

## 2023-09-18 LAB — LH: LH: 5.2 m[IU]/mL

## 2023-09-18 LAB — FOLLICLE STIMULATING HORMONE: FSH: 7 m[IU]/mL

## 2023-09-18 LAB — ESTRADIOL: ESTRADIOL: 40 pg/mL

## 2023-09-21 MED ORDER — estradiol (Vagifem) 10 mcg tablet vaginal tablet
10 | ORAL_TABLET | VAGINAL | 0 refills | Status: AC
Start: 2023-09-21 — End: ?
  Filled 2023-09-22: qty 24, 49d supply, fill #0

## 2023-10-27 MED ORDER — lisdexamfetamine (Vyvanse) 30 mg capsule
30 | ORAL_CAPSULE | Freq: Every morning | ORAL | 0 refills | Status: DC
Start: 2023-10-27 — End: 2023-12-21

## 2023-10-27 MED ORDER — albuterol 90 mcg/actuation inhaler
90 | Freq: Four times a day (QID) | RESPIRATORY_TRACT | 0 refills | 25.00000 days | Status: AC | PRN
Start: 2023-10-27 — End: 2023-12-21
  Filled 2023-10-27: qty 8.5, 25d supply, fill #0

## 2023-10-27 MED ORDER — gabapentin (Neurontin) 100 mg capsule
100 | ORAL_CAPSULE | ORAL | 0 refills | Status: AC
Start: 2023-10-27 — End: ?
  Filled 2023-10-27: qty 180, 90d supply, fill #0

## 2023-10-27 MED FILL — PHENTERMINE 37.5 MG CAPSULE: 37.5 37.5 mg | ORAL | 30 days supply | Qty: 30 | Fill #1 | Status: CP

## 2023-10-27 NOTE — Telephone Encounter (Signed)
 Last seen 09/16/23  Upcoming vis 12/21/23  PMP SOLD 08/26/23, QTY 30, 0 refills

## 2023-11-23 MED FILL — PANTOPRAZOLE 40 MG TABLET,DELAYED RELEASE: 40 40 mg | ORAL | 90 days supply | Qty: 90 | Fill #1 | Status: CP

## 2023-11-24 MED FILL — VENLAFAXINE ER 225 MG TABLET,EXTENDED RELEASE 24 HR: 225 225 mg | ORAL | 90 days supply | Qty: 90 | Fill #3 | Status: CP

## 2023-12-14 ENCOUNTER — Inpatient Hospital Stay: Admit: 2023-12-14 | Payer: PRIVATE HEALTH INSURANCE | Attending: Gastroenterology | Primary: Family Medicine

## 2023-12-14 DIAGNOSIS — K224 Dyskinesia of esophagus: Secondary | ICD-10-CM

## 2023-12-14 DIAGNOSIS — K317 Polyp of stomach and duodenum: Secondary | ICD-10-CM

## 2023-12-14 LAB — POCT PREGNANCY, URINE: POC hCG Qual, Ur: NEGATIVE

## 2023-12-14 MED ORDER — onabotulinumtoxinA (Botox) injection 100 Units
100 | Freq: Once | INTRAMUSCULAR | Status: AC
Start: 2023-12-14 — End: 2023-12-14
  Administered 2023-12-14: 15:00:00 100 [IU] via INTRAMUSCULAR

## 2023-12-14 MED ORDER — onabotulinumtoxinA (Botox) injection 100 Units
100 | Freq: Once | INTRAMUSCULAR | Status: AC
Start: 2023-12-14 — End: 2023-12-14
  Administered 2023-12-14: 14:00:00 100 [IU] via INTRAMUSCULAR

## 2023-12-14 MED ORDER — lactated Ringer's infusion
INTRAVENOUS | Status: DC
Start: 2023-12-14 — End: 2023-12-15
  Administered 2023-12-14: 14:00:00 50 mL/h via INTRAVENOUS

## 2023-12-14 MED ORDER — haloperidol lactate (Haldol) injection 1 mg
5 | Freq: Once | INTRAMUSCULAR | Status: DC | PRN
Start: 2023-12-14 — End: 2023-12-15

## 2023-12-14 MED ORDER — ondansetron (Zofran) 4 mg/2 mL injection  - Omnicell Override Pull
4 | INTRAMUSCULAR | Status: AC
Start: 2023-12-14 — End: ?

## 2023-12-14 MED ORDER — ondansetron (Zofran) injection 4 mg
4 | INTRAMUSCULAR | Status: DC | PRN
Start: 2023-12-14 — End: 2023-12-15
  Administered 2023-12-14: 15:00:00 4 mg via INTRAVENOUS

## 2023-12-14 MED ORDER — propofol (Diprivan) infusion 10 mg/mL
10 | INTRAVENOUS | Status: DC | PRN
Start: 2023-12-14 — End: 2023-12-14
  Administered 2023-12-14: 15:00:00 30 via INTRAVENOUS
  Administered 2023-12-14: 15:00:00 100 via INTRAVENOUS
  Administered 2023-12-14 (×2): 30 via INTRAVENOUS
  Administered 2023-12-14: 15:00:00 20 via INTRAVENOUS
  Administered 2023-12-14: 15:00:00 30 via INTRAVENOUS
  Administered 2023-12-14: 15:00:00 20 via INTRAVENOUS

## 2023-12-14 MED ORDER — lidocaine (Xylocaine) 20 mg/mL (2 %) injection
20 | INTRAMUSCULAR | Status: DC | PRN
Start: 2023-12-14 — End: 2023-12-14
  Administered 2023-12-14: 15:00:00 40 via INTRAVENOUS

## 2023-12-14 MED FILL — ONABOTULINUMTOXINA 100 UNIT SOLUTION FOR INJECTION: 100 100 unit | INTRAMUSCULAR | Qty: 100 | Fill #0

## 2023-12-14 MED FILL — ONDANSETRON HCL (PF) 4 MG/2 ML INJECTION SOLUTION: 4 4 mg/2 mL | INTRAMUSCULAR | Qty: 2 | Fill #0

## 2023-12-14 NOTE — Other (Signed)
 Patient Education  Table of Contents   Esophageal Stricture   Monitored Anesthesia Care, Care After    To view videos and all your education online visit,  [email or web address]  or scan this QR code with your smartphone.  Access to this content will expire in one year.  Esophageal Stricture    An esophageal stricture is where part of your esophagus gets too narrow. The esophagus is the part of your body that moves food from your mouth to your stomach. It can narrow if you have a disease or damage to the area.  What are the causes?  A stricture may be caused by:   Gastroesophageal reflux disease (GERD). This is when food and stomach contents back up into your esophagus.   Swallowing something harmful.   Damage from medical tools.   Radiation therapy.   Cancer.   Irritation and swelling of your esophagus.  What are the signs or symptoms?  A stricture can make it hard or painful to swallow. It also makes you more likely to choke. You may also have:   Heartburn.   Vomiting.   Loss of weight for no known reason.  How is this diagnosed?  A stricture may be diagnosed based on your symptoms and an exam. You may also need tests. These may include:   An upper endoscopy. This is when a tube called an endoscope is put into your esophagus. It's used to check for a stricture. A tissue sample may also be taken and looked at in a lab.   Esophageal pH monitoring. This is when a tube measures how much stomach acid goes into your esophagus. It also checks how long the acid stays there.   A barium swallow test. During this test, you swallow a chalky liquid that makes your esophagus easier to see. Then an X-ray is taken.  How is this treated?  Treatment depends on how bad the stricture is and what caused it. Treatment may include:   Esophageal dilation. This is when tools are put in your esophagus to stretch it.   A stent. This is a small tube put in your esophagus to keep it open. The stent may stay in place for a few  weeks.   Medicines.  Follow these instructions at home:  Eating and drinking   Follow instructions from your health care provider about what you may eat and drink.   Eat soft foods that are easy to swallow.   When you eat:  ? Sit upright.  ? Cut your food into small pieces.  ? Chew well.  ? Eat slowly.  ? Stop when you're full.   Do not drink alcohol.   Do not eat in the 2 hours before you go to bed.   Do not eat things that can make heartburn worse. These include:  ? Red meat.   ? Fried and fatty foods.  ? Spicy foods.  ? Soda.  ? Foods and drinks with tomato in them.  ? Chocolate.  General instructions   Take over-the-counter and prescription medicines only as told by your provider.   Do not use any products that contain nicotine or tobacco. These products include cigarettes, chewing tobacco, and vaping devices, such as e-cigarettes. If you need help quitting, ask your provider.   When you're in bed, raise your head with pillows. This can help stop stomach contents from backing up into your esophagus while you sleep.   Lose weight if needed.   Wear loose clothes.  Return to your normal activities as told by your provider. Ask your provider what activities are safe for you.  Contact a health care provider if:   You have trouble eating.   You have trouble swallowing.   You vomit food and liquid.   Your symptoms don't get better with treatment.  Get help right away if:   You vomit every time you eat or drink.   You can't keep down your saliva.  This information is not intended to replace advice given to you by your health care provider. Make sure you discuss any questions you have with your health care provider.  Document Released: 2006-03-03 Document Updated: 2022-09-19 Document Reviewed: 2022-09-19  Elsevier Patient Education ? 2025 [employer]  Monitored Anesthesia Care, Care After  The following information offers guidance on how to care for yourself after your procedure. Your health care provider may also  give you more specific instructions. If you have problems or questions, contact your health care provider.  What can I expect after the procedure?  After the procedure, it is common to have:   Tiredness.   Little or no memory about what happened during or after the procedure.   Impaired judgment when it comes to making decisions.   Nausea or vomiting.   Some trouble with balance.  Follow these instructions at home:  For the time period you were told by your health care provider:     Rest.   Do not participate in activities where you could fall or become injured.   Do not drive or use machinery.   Do not drink alcohol.   Do not take sleeping pills or medicines that cause drowsiness.   Do not make important decisions or sign legal documents.   Do not take care of children on your own.  Medicines   Take over-the-counter and prescription medicines only as told by your health care provider.   If you were prescribed antibiotics, take them as told by your health care provider. Do not stop using the antibiotic even if you start to feel better.  Eating and drinking   Follow instructions from your health care provider about what you may eat and drink.   Drink enough fluid to keep your urine pale yellow.   If you vomit:  ? Drink clear fluids slowly and in small amounts as you are able. Clear fluids include water, ice chips, low-calorie sports drinks, and fruit juice that has water added to it (diluted fruit juice).  ? Eat light and bland foods in small amounts as you are able. These foods include bananas, applesauce, rice, lean meats, toast, and crackers.  General instructions     Have a responsible adult stay with you for the time you are told. It is important to have someone help care for you until you are awake and alert.   If you have sleep apnea, surgery and some medicines can increase your risk for breathing problems. Follow instructions from your health care provider about wearing your sleep device:  ? When you are  sleeping. This includes during daytime naps.  ? While taking prescription pain medicines, sleeping medicines, or medicines that make you drowsy.   Do not use any products that contain nicotine or tobacco. These products include cigarettes, chewing tobacco, and vaping devices, such as e-cigarettes. If you need help quitting, ask your health care provider.  Contact a health care provider if:   You feel nauseous or vomit every time you eat or drink.  You feel light-headed.   You are still sleepy or having trouble with balance after 24 hours.   You get a rash.   You have a fever.   You have redness or swelling around the IV site.  Get help right away if:   You have trouble breathing.   You have new confusion after you get home.  These symptoms may be an emergency. Get help right away. Call 911.   Do not wait to see if the symptoms will go away.   Do not drive yourself to the hospital.  This information is not intended to replace advice given to you by your health care provider. Make sure you discuss any questions you have with your health care provider.  Document Released: 2015-10-14 Document Updated: 2021-11-18 Document Reviewed: 2021-11-18  Elsevier Patient Education ? 2025 [employer].

## 2023-12-14 NOTE — Anesthesia Post-Procedure Evaluation (Signed)
 Patient: Price Broody    Procedure Summary       Date: 12/14/23 Room / Location: [facility]    Anesthesia Start: 1011 Anesthesia Stop: 1052    Procedure: EGD Diagnosis:       Dyskinesia of esophagus      (Dysphagia)    Scheduled Providers: Milo Almond, MD; Nunzio Belch; Adelene Homer, MD; Waymond Hailey, RN Responsible Provider: Adelene Homer, MD    Anesthesia Type: MAC ASA Status: 2            Anesthesia Type: MAC    Vitals Value Taken Time   BP 108/58 12/14/23 11:13   Temp  12/14/23 15:53   Pulse 70 12/14/23 11:13   Resp 18 12/14/23 11:13   SpO2 99 % 12/14/23 11:13       Anesthesia Post Evaluation Note:    Patient location during evaluation: PACU  Patient participation: able to participate  Level of consciousness: arousable  Cardiovascular and Hydration status: stable  Respiratory Status Stable and Airway Patent: yes  Nausea and Vomiting Control Satisfactory: yes  Pain management: adequate     Vitals reviewed: yes  Unplanned ICU Admission: no      There were no known notable events for this encounter.

## 2023-12-14 NOTE — Anesthesia Pre-Procedure Evaluation (Signed)
 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       Other   (+) Piriformis syndrome of right side       Clinical information reviewed:   Tobacco  Allergies  Meds   Med Hx  Surg Hx  OB Status  Fam Hx  Soc   Hx         Physical Exam    Airway  Mallampati: I  TM distance: >3 FB  Mouth opening: >3 FB  Neck ROM: full     Cardiovascular     Rhythm: regular  Rate: normal  Functional capacity:  greater than or equal to 4 METS without symptoms   Dental - normal exam     Pulmonary - normal exam   Abdominal - normal exam   General   Alert                   Anesthesia Plan    ASA 2   NPO status verified    MAC     Airway: natural airway  Monitoring: standard monitors    Multimodal PONV prophylaxis planned  Essential imaging and labs available and reviewed    Anesthetic plan and risks discussed with patient.

## 2023-12-14 NOTE — H&P (Signed)
 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    Medications  Medications        Start Medication Dose/Rate, Route, Frequency Ordered Stop    12/14/23 1030 lactated Ringer's infusion         50 mL/hr, IV, Continuous 12/14/23 0945 --                     Past Medical History   has a past medical history of Anemia, Anxiety, Asthma (Multi-HCC), GERD (gastroesophageal reflux disease), Migraines, and Motion sickness.    Allergies  Codeine    Surgical History   has a past surgical history that includes Tonsillectomy (2002); LASIK (2005); and Esophagogastroduodenoscopy (2022).     Family History  Family History[1]    Social History   reports that she has never smoked. She has never used smokeless tobacco. She reports that she does not currently use alcohol. She reports current drug use. Drug: Marijuana.    Vitals:  Temp: 36.4 C (97.6 F)  Pulse: 79  BP: 134/85  Resp: 16  SpO2: 100 %      Temp:  [36.4 C (97.6 F)] 36.4 C (97.6 F)  Pulse:  [79] 79  Resp:  [16] 16  SpO2:  [100 %] 100 %  BP: (134)/(85) 134/85     Physical Exam:  General: Alert and oriented, in no acute distress  Abdomen: Soft, nontender, nondistended.  No hepatosplenomegaly or masses.    Lab Results:  Hospital Outpatient Visit on 12/14/2023   Component Date Value Ref Range Status    POC hCG Qual, Ur 12/14/2023 Negative   Final       Impression:    For endoscopy with Botox injection.  Her last endoscopy was performed in January 2024 at which time 100 units were injected 1 to 2 cm above the GE junction and 100 units were injected in the midesophagus.  This will be repeated today as she has had a good response to this.  Bleeding, perforation of the esophagus, and systemic absorption of the botulinum toxin resulting in cardiopulmonary collapse were discussed and informed consent was obtained and placed in the chart.       [1]   Family History  Problem Relation Name  Age of Onset    Hypertension Mother Erenest Hatchet McGillicuddy     Diabetes Mother Erenest Hatchet McGillicuddy     Hypothyroidism Mother Erenest Hatchet McGillicuddy     Other (renal cell cancer) Mother Dolores McGillicuddy     Other (orthostatic tremor) Mother Erenest Hatchet McGillicuddy     Heart failure Mother Erenest Hatchet McGillicuddy     Heart attack Mother Erenest Hatchet McGillicuddy     Anxiety disorder Father Eugenio Hew     Basal cell carcinoma Father Eugenio Hew     Depression Father Eugenio Hew     Cardiomyopathy Father Eugenio Hew     Heart disease Father Eugenio Hew     Drug abuse Brother      Heart attack Maternal Grandmother

## 2023-12-21 ENCOUNTER — Ambulatory Visit: Admit: 2023-12-21 | Discharge: 2023-12-21 | Payer: PRIVATE HEALTH INSURANCE | Primary: Family Medicine

## 2023-12-21 DIAGNOSIS — K224 Dyskinesia of esophagus: Secondary | ICD-10-CM

## 2023-12-21 MED ORDER — Concerta 36 mg ER tablet
36 | ORAL_TABLET | Freq: Every morning | ORAL | 0 refills | 30.00000 days | Status: DC
Start: 2023-12-21 — End: 2023-12-31

## 2023-12-21 NOTE — Assessment & Plan Note (Signed)
 Working with her therapist and taking venlafaxine. She will continue on her current dose. Declined any changes.

## 2023-12-21 NOTE — Progress Notes (Signed)
 [facility]  [facility]  [address]  [address]  [place] [address]  Dept: [phone]  Dept Fax: [phone]     Patient ID: [name] is a 48 y.o. female who presents for Follow-up (70m F/U- no cocnerns per patient ).    Subjective   HPI    [name] is here today for a follow-up visit.     She had a botox injection last week for nutcracker esophagus by Dr. Leellen. Starting to feel better. She had manometry with pH testing. pH was normal. She is on Protonix because when she spasms, they want to avoid reflux. Is on gabapentin, steady at 100-200 mg but to call him if she gets symptoms.     Still on Effexor and is still working on coping mechanisms for her anxiety. Still getting therapy. Doesn't want to change her meds. She is on the 225 mg dose. Still moving forward with her divorce.     Vaginal dryness continues to be an issue on the outside. Very itchy. No rash. Same toilet paper. Uses Charmin. She has the [place] IUD. She wasn't compliant with the Vagifem. Declines further prescriptions. Coconut oil has worked.     Weight now 152 lb. Hasn't been on anything since December. Never took the phentermine. Has been maintaining. Exercising but less than she would like. However she is doing yoga a few times a week.         Problem List[1]  Current Outpatient Medications   Medication Instructions    albuterol 90 mcg/actuation inhaler INHALE 2 PUFFS BY MOUTH EVERY 6 HOURS AS NEEDED FOR WHEEZING    azelastine (Astelin) 137 mcg (0.1 %) nasal spray 2 sprays, Each Nostril, Every 12 hours    estradiol (Vagifem) 10 mcg tablet vaginal tablet Insert 1 tablet in the vaginal nightly for 2 weeks. Then decrease to 2 nights a week.    fexofenadine (ALLEGRA) 180 mg, Daily    gabapentin (Neurontin) 100 mg capsule TAKE 1 CAPSULE BY MOUTH 3 TIMES A DAY    gabapentin (Neurontin) 100 mg capsule TAKE 1 CAPSULE BY MOUTH TWICE A DAY FOR 90 DAYS.    lisdexamfetamine (Vyvanse) 30 mg capsule TAKE 1 CAPSULE (30 MG) BY MOUTH  IN THE MORNING.    ondansetron (ZOFRAN) 4 mg, oral, Every 8 hours PRN    pantoprazole (ProtoNix) 40 mg EC tablet TAKE 1 TABLET BY MOUTH EVERY DAY    phentermine 37.5 mg, oral, Daily before breakfast    venlafaxine 225 mg, oral, Daily, Do not crush, chew, or split.    Wegovy 0.5 mg, subcutaneous, Every 7 days       Objective   Visit Vitals  BP 114/89   Pulse 81   Wt 68.9 kg Comment: 152lbs   SpO2 96%   BMI 27.80 kg/m   OB Status IUD   BSA 1.74 m       Physical Exam  Constitutional:       Appearance: Normal appearance.   HENT:      Mouth/Throat:      Mouth: Mucous membranes are moist.      Pharynx: Oropharynx is clear.   Neck:      Thyroid: No thyroid mass or thyromegaly.   Cardiovascular:      Rate and Rhythm: Normal rate and regular rhythm.      Heart sounds: Normal heart sounds.   Pulmonary:      Effort: Pulmonary effort is normal.  Breath sounds: Normal breath sounds.   Musculoskeletal:      Cervical back: Neck supple.   Lymphadenopathy:      Cervical: No cervical adenopathy.   Skin:     General: Skin is warm and dry.      Comments: Dry patches on chest.   Neurological:      General: No focal deficit present.      Mental Status: She is alert.   Psychiatric:         Thought Content: Thought content normal.       Assessment / Plan  Assessment & Plan  Nutcracker esophagus  Doing much better since she started getting Botox injections. Seeing Dr. Ragena and very pleased. Is also on 100 mg gabapentin, increases to twice a day if symptoms worse.        Anxiety  Working with her therapist and taking venlafaxine. She will continue on her current dose. Declined any changes.        Vaginal dryness  Responding to coconut oil.        Total visit time 35 min. which includes visit preparation, review of consult notes, face to face time with patient, ordering/reviewing labs/imaging/meds and documentation of visit.    Follow-up for annual visit or prn.                       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

## 2023-12-31 MED ORDER — methylphenidate (Concerta) 36 mg ER tablet
36 | ORAL_TABLET | Freq: Every morning | ORAL | 0 refills | 30.00000 days | Status: DC
Start: 2023-12-31 — End: 2024-02-17
  Filled 2024-01-01: qty 30, 30d supply, fill #0

## 2023-12-31 NOTE — Patient Instructions (Signed)
 PA Denied because patient has not tried generic form of medication, or there is no documentation that patient needs brand name and why they would need it.

## 2024-01-13 ENCOUNTER — Encounter
Admit: 2024-01-13 | Discharge: 2024-01-13 | Payer: PRIVATE HEALTH INSURANCE | Attending: Obstetrics & Gynecology | Primary: Family Medicine

## 2024-01-13 DIAGNOSIS — Z7189 Other specified counseling: Principal | ICD-10-CM

## 2024-01-13 MED ORDER — estradiol (Vivelle-DOT) 0.025 mg/24 hr
0.025 | MEDICATED_PATCH | TRANSDERMAL | 1 refills | 84.00000 days | Status: DC
Start: 2024-01-13 — End: 2024-01-22
  Filled 2024-01-14: qty 8, 28d supply, fill #0

## 2024-01-13 NOTE — Progress Notes (Signed)
 This real-time, interactive virtual Telehealth encounter was done by video with the patient's verbal consent. Two patient identifiers were used and confirmed. Physical location of home to the patient was located in [place] at the time of the visit.  Physical location of the provider: office. Other participants/involvement: none      48 yo with skyla IUD in place to discuss HRT. Reports worsening hot flushes at night, wakes up 4-5 times, some mood changes.  Has light menses with IUD. Tried vagifem  for dryness but did not notice much change.    +exercise   Pap up to date   We discussed HRT - discussed lowest dose, shortest amount of time.  Discussed possible benefits for bone, brain and CV health as well as vasomotor and vaginal symptom relief.   We reviewed WHI study and differences in current treatment we discussed E2 is different type of estrogen - less inflammatory than CEE, we reviewed micronized progesterone is also less thrombogenic compared to medroxyprogesterone that was studied in [facility]. We reviewed with E2 no significant increased risk of Breast CA, or clot risk.      No HTN, no DM, non smoker, no increased breast CA risk      Will start vivelle  dot 0.025mg /24 hr - has skyla for  endometrial protection   Will reassess dose after 4 weeks if stable will continue if still having hot flashes can increase to 0.0375    Encouraged continued healthy habits of CV exercise, resistance training, high protein/low inflammatory diet, Ca/Vit D supplementation, continuing with annual preventative health visits

## 2024-01-22 MED ORDER — estradiol (Vivelle-DOT) 0.0375 mg/24 hr
0.0375 | MEDICATED_PATCH | TRANSDERMAL | 3 refills | 84.00000 days | Status: AC
Start: 2024-01-22 — End: ?
  Filled 2024-01-22: qty 8, 28d supply, fill #0

## 2024-02-15 ENCOUNTER — Encounter: Payer: PRIVATE HEALTH INSURANCE | Primary: Family Medicine

## 2024-02-17 MED ORDER — methylphenidate (Concerta) 36 mg ER tablet
36 | ORAL_TABLET | Freq: Every morning | ORAL | 0 refills | 30.00000 days | Status: DC
Start: 2024-02-17 — End: 2024-03-25
  Filled 2024-02-17: qty 30, 30d supply, fill #0

## 2024-02-17 MED ORDER — venlafaxine 225 mg 24 hr tablet
225 | ORAL_TABLET | Freq: Every day | ORAL | 3 refills | 30.00000 days | Status: AC
Start: 2024-02-17 — End: 2025-02-16
  Filled 2024-02-18: qty 90, 90d supply, fill #0

## 2024-02-17 MED FILL — ESTRADIOL 0.0375 MG/24 HR SEMIWEEKLY TRANSDERMAL PATCH: 0.0375 0.0375 mg/24 hr | TRANSDERMAL | 28 days supply | Qty: 8 | Fill #1 | Status: CP

## 2024-02-17 NOTE — Telephone Encounter (Signed)
 Last seen 12/21/23  PMP SOLD 01/01/24, QTY 30, 0 refills   Last filled 02/12/23, QTY 90, 3 refills

## 2024-02-22 MED FILL — PANTOPRAZOLE 40 MG TABLET,DELAYED RELEASE: 40 40 mg | ORAL | 90 days supply | Qty: 90 | Fill #2 | Status: CP

## 2024-03-14 MED FILL — ESTRADIOL 0.0375 MG/24 HR SEMIWEEKLY TRANSDERMAL PATCH: 0.0375 0.0375 mg/24 hr | TRANSDERMAL | 28 days supply | Qty: 8 | Fill #2 | Status: AC

## 2024-03-23 ENCOUNTER — Ambulatory Visit: Admit: 2024-03-23 | Discharge: 2024-03-23 | Payer: PRIVATE HEALTH INSURANCE | Primary: Family Medicine

## 2024-03-23 DIAGNOSIS — L292 Pruritus vulvae: Principal | ICD-10-CM

## 2024-03-23 LAB — POCT URINE DRUG SCREEN
Amphetamine Urine Screen: NOT DETECTED
Barbiturate Urine Screen: NOT DETECTED
Benzodiazepines Urine Screen: NOT DETECTED
Buprenorphine Urine Screen: NOT DETECTED
Cannabinoids, Urine Screen: POSITIVE
Cocaine Urine Screen: NOT DETECTED
EDDP Quant, Ur: NOT DETECTED
MDMA URINE SCREEN: NOT DETECTED
Methadone Urine Screen: NOT DETECTED
Morphine Urine Screen: NOT DETECTED
Oxidant: NORMAL
Oxycodone Urine Screen: NOT DETECTED
PH, UA: 7 (ref 3.9–8.1)
POC Methamphetamine Urine Screen: NOT DETECTED
Phencyclidine Urine Screen: NOT DETECTED
Specific Gravity, UA, POC: 1.005 (ref 1.004–1.035)
TCA Scrn: NOT DETECTED

## 2024-03-23 MED ORDER — clobetasol (Temovate) 0.05 % cream
0.05 | Freq: Two times a day (BID) | TOPICAL | 0 refills | Status: AC
Start: 2024-03-23 — End: 2024-06-16
  Filled 2024-03-23: qty 30, 14d supply, fill #0

## 2024-03-23 NOTE — Assessment & Plan Note (Signed)
 She is actually doing better on Concerta  than Vyvanse , which wasn't covered by her insurance. She signed med agreement and did urine drug screen. It was negative for amphetamines but we discussed that the test often misses methylphenidate .  Orders:    POCT Urinary Drug Test

## 2024-03-23 NOTE — Assessment & Plan Note (Signed)
 Mood is stable on current dosage of Effexor , especially in light of the stress from her divorce proceedings.

## 2024-03-23 NOTE — Progress Notes (Signed)
 [facility]  [address]  [address]  [place] [address]  Dept: [phone]  Dept Fax: [phone]     Patient ID: [name] is a 48 y.o. female who presents for Follow-up (Patient is here today for # month f/u).    Subjective   HPI    [name] is here today for follow-up of a few issues.     She is in the midst of a divorce. She's doing OK. Getting through it. I'm miserable but waking up in the morning and pushing through. She wanted to come in because she wasn't sure how she would feel 3 months from her last visit. Doesn't want to get back to where she was. She missed a dose of Effexor  and had withdrawal symptoms. I felt like hell. Headache, brain fog. She wants to stay on this dose. Doesn't want to go higher. Affect is good and she's not as emotional as she would be without it. Doesn't find it numbing. No side effects.     She has a rash that she thinks is eczema. Is everywhere. Also on labia. Always scratching. Back of head.     Concerta  is working very well. Insurance wouldn't cover Vyvanse . She notes now that Vyvanse  made her racy. She was on Concerta  for years. She is much calmer.     Has a new partner. She has known him a long time. Sexually active. Using condoms. Itch started before then.     Uses Dove soap for sensitive skin. Same shampoo as always. Tide detergent. Uses dryer sheets. Occasionally uses perfume. Very stressed. Has been present at least 1-2 months. As a child she had bad eczema especially back of head. Can't stop itching. Takes an Allegra daily.     Problem List[1]  Current Outpatient Medications   Medication Instructions    albuterol  90 mcg/actuation inhaler INHALE 2 PUFFS BY MOUTH EVERY 6 HOURS AS NEEDED FOR WHEEZING    clobetasol  (Temovate ) 0.05 % cream Topical, 2 times daily, Limit usage to 2 weeks    estradiol  (Vivelle -DOT) 0.0375 mg/24 hr 1 patch, transdermal, 2 times weekly    fexofenadine (ALLEGRA) 180 mg, Daily    gabapentin  (Neurontin ) 100 mg  capsule TAKE 1 CAPSULE BY MOUTH 3 TIMES A DAY    gabapentin  (Neurontin ) 100 mg capsule TAKE 1 CAPSULE BY MOUTH TWICE A DAY FOR 90 DAYS.    methylphenidate  (CONCERTA ) 36 mg, oral, Every morning, Do not crush, chew, or split.    pantoprazole  (ProtoNix ) 40 mg EC tablet TAKE 1 TABLET BY MOUTH EVERY DAY    venlafaxine  225 mg, oral, Daily, Do not crush, chew, or split.       Objective   Visit Vitals  BP 122/70 (BP Location: Left arm, Patient Position: Sitting, BP Cuff Size: Adult)   Pulse 68   Ht 1.575 m   Wt 70.8 kg Comment: 156 lbs   SpO2 98%   BMI 28.53 kg/m   OB Status IUD   BSA 1.76 m       Physical Exam  Constitutional:       Appearance: Normal appearance.   Genitourinary:         Comments: Itchy area above but unimpressive, no discharge  Skin:     Comments: Tiny papules on slightly hyperpigmented base under her right breast; not very remarkable; some scaly papules on back of head   Neurological:      Mental Status: She is alert.  Psychiatric:         Mood and Affect: Mood normal.       Assessment / Plan  Assessment & Plan  Attention deficit disorder of adult  She is actually doing better on Concerta  than Vyvanse , which wasn't covered by her insurance. She signed med agreement and did urine drug screen. It was negative for amphetamines but we discussed that the test often misses methylphenidate .  Orders:    POCT Urinary Drug Test    Vulvar itching  I took a culture but this was unimpressive. She has an estrogen patch from GYN but it isn't helping. Will try clobetasol . Warned to limit usage to 2 weeks and then taper off.   Orders:    clobetasol  (Temovate ) 0.05 % cream; Apply topically twice daily. Limit usage to 2 weeks    Advanced Vaginitis Plus, TMA; Future    Dermatitis  Unimpressive rash but very itchy. She can try clobetasol  sparingly. Continue daily moisturizing with Ceravee eczema cream as she has been using. This may be stress-induced.        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        She will schedule physical in 3 months as she is overdue. Mammogram booked for later this month.       Total visit time 40 min. which includes visit preparation, review of consult notes, face to face time with patient, ordering/reviewing labs/imaging/meds and documentation of visit.                       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

## 2024-03-25 NOTE — Telephone Encounter (Signed)
 Last seen on 03/23/2024  Upcoming OV on 06/16/2024    Pmp; last filled on 02/18/2024, qty of 30 for 30 days, 0 refills

## 2024-03-26 MED ORDER — methylphenidate (Concerta) 36 mg ER tablet
36 | ORAL_TABLET | Freq: Every morning | ORAL | 0 refills | 30.00000 days | Status: AC
Start: 2024-03-26 — End: 2024-04-27
  Filled 2024-03-28: qty 30, 30d supply, fill #0

## 2024-03-27 LAB — ADVANCED VAGINITIS PLUS, TMA
Candida albicans, NAA: NEGATIVE
Candida glabrata, NAA: NEGATIVE
Chlamydia trachomatis, NAA: NEGATIVE
Neisseria gonorrhoeae, NAA: NEGATIVE
Trich vag by NAA: NEGATIVE

## 2024-03-28 MED ORDER — predniSONE (Deltasone) 10 mg tablet
10 | ORAL_TABLET | ORAL | 0 refills | 30.00000 days | Status: AC
Start: 2024-03-28 — End: 2024-04-09
  Filled 2024-03-28: qty 42, 12d supply, fill #0

## 2024-04-02 ENCOUNTER — Inpatient Hospital Stay: Admit: 2024-04-02 | Payer: PRIVATE HEALTH INSURANCE | Primary: Family Medicine

## 2024-04-02 DIAGNOSIS — Z1231 Encounter for screening mammogram for malignant neoplasm of breast: Secondary | ICD-10-CM

## 2024-04-05 MED ORDER — gabapentin (Neurontin) 100 mg capsule
100 | ORAL_CAPSULE | ORAL | 0 refills | 30.00000 days | Status: AC
Start: 2024-04-05 — End: ?
  Filled 2024-04-05: qty 180, 90d supply, fill #0

## 2024-04-19 MED FILL — ESTRADIOL 0.0375 MG/24 HR SEMIWEEKLY TRANSDERMAL PATCH: 0.0375 0.0375 mg/24 hr | TRANSDERMAL | 84 days supply | Qty: 24 | Fill #4 | Status: CN

## 2024-04-19 MED FILL — ESTRADIOL 0.0375 MG/24 HR SEMIWEEKLY TRANSDERMAL PATCH: 0.0375 0.0375 mg/24 hr | TRANSDERMAL | 84 days supply | Qty: 24 | Fill #3 | Status: AC

## 2024-05-13 MED FILL — PANTOPRAZOLE 40 MG TABLET,DELAYED RELEASE: 40 40 mg | ORAL | 90 days supply | Qty: 90 | Fill #3 | Status: CP

## 2024-05-13 MED FILL — VENLAFAXINE ER 225 MG TABLET,EXTENDED RELEASE 24 HR: 225 225 mg | ORAL | 90 days supply | Qty: 90 | Fill #1 | Status: CP

## 2024-05-13 NOTE — Telephone Encounter (Signed)
 Last filled 03/26/24  Last seen 03/23/24  Next ov 06/16/24

## 2024-05-14 MED ORDER — METHYLPHENIDATE ER 36 MG TABLET,EXTENDED RELEASE 24 HR
36 | ORAL_TABLET | Freq: Every morning | ORAL | 0 refills | 30.00000 days | Status: DC
Start: 2024-05-14 — End: 2024-07-13
  Filled 2024-05-14: qty 30, 30d supply, fill #0

## 2024-05-30 MED ORDER — GUANFACINE ER 1 MG TABLET,EXTENDED RELEASE 24 HR
1 | ORAL_TABLET | Freq: Every evening | ORAL | 1 refills | 90.00000 days | Status: DC
Start: 2024-05-30 — End: 2024-07-13
  Filled 2024-05-30: qty 30, 30d supply, fill #0

## 2024-06-16 ENCOUNTER — Ambulatory Visit: Admit: 2024-06-16 | Discharge: 2024-06-16 | Payer: PRIVATE HEALTH INSURANCE | Primary: Family Medicine

## 2024-06-16 DIAGNOSIS — Z0000 Encounter for general adult medical examination without abnormal findings: Principal | ICD-10-CM

## 2024-06-16 DIAGNOSIS — B349 Viral infection, unspecified: Secondary | ICD-10-CM

## 2024-06-16 LAB — POCT INFLUENZA A/B ID NOW ABBOTT
POCT Influenza A Ag: NEGATIVE
POCT Influenza B Ag: NEGATIVE

## 2024-06-16 LAB — POC STREP GRP A MOLECULAR (NAAT): Strep Grp A NAAT: NEGATIVE

## 2024-06-16 NOTE — Progress Notes (Deleted)
 [facility]  [address]  [address]  [place] [address]  [phone]    Patient ID: [name] is a 48 y.o. female who presents for her annual exam    Concerns today:     [name], GYN, sent in estrogen patch for HRT. Has Skyla IUD for endometrial protecftion. I will take over if effective.   Midst of divorce. Mood  Recent rash. Sent in prednisone     Recent Consults and Problems:   11/2022 Dr. Luke, podiatry, left bunion cortisone injection  08/2022 Dr. Rik acute flare of lateral epicondylitis, was in a cast for a month  Dr. Lennart for dyskinesia of esophagus, had botox  to the esophagus and she is much better    Screening Tests:  PAP:   04/2018 NILM, negative HPV  02/2023 NILM, negative HPV  no GYN concerns, declined pelvic exam  Mammogram:   07/2021 [facility] 1   (no-show 07/2022), she will call to schedule  03/2024 [facility] 1  Colonoscopy: average risk,   start age 56, no FH colon cancer  Will refer to Dr. Sammie who has seen her for GI issues, IGC  Bone Density: average risk, start age 27  LDCT: N/A, never smoker    Immunizations:   [employer] x 5, most recent 04/2022  Hep B x 3  Flu 03/2023, today?  Tdap 2018    ROS:   Skin care: nothing new  Vision: Lasik, hasn't seen eye specialist, had an eye exam @ [facility]  Dental care: UTD, flosses  Periods: had IUD x 16 years; no periods, would like hormone testing, bloated, emotional hot flashes; Liletta placed 09/02/2019, good until 2029. Dr. Ezzard inserted  Sexual activity: with husband, no issues  Contraception: Liletta inserted 08/2019, good through 08/2027  All other systems negative except as documented above.     Social History:  reports that she has never smoked. She has never used smokeless tobacco. She reports current alcohol use. She reports that she does not currently use drugs after having used the following drugs: Marijuana.     Diet: healthy for the most part, tried Wegovy  but it made her really nauseous; went to [employer], stopped  it  Exercise: exercise hurts; stopped seeing [employer], chiropractor because her back is good   Alcohol: minimal  Drugs: THC gummies couple times a week, no ativan  Tobacco: never  Occupation: labor and delivery at [employer], hasn't had ativan in a couple of years  Household/Family: husband [name], 3 kids [name], [name]      Family History: family history includes Anxiety disorder in her father; Basal cell carcinoma in her father; Cardiomyopathy in her father; Depression in her father; Diabetes in her mother; Drug abuse in her brother; Heart attack in her maternal grandmother; Heart disease in her father; Heart failure in her mother; Hypertension in her mother; Hypothyroidism in her mother; orthostatic tremor in her mother; renal cell cancer in her mother.     Recent changes in Family History: sister died of lung cancer last year, brother died in 05/11/24from sudden cardiac arrest    Medications:    Current Outpatient Medications   Medication Instructions    albuterol  90 mcg/actuation inhaler INHALE 2 PUFFS BY MOUTH EVERY 6 HOURS AS NEEDED FOR WHEEZING    clobetasol  (Temovate ) 0.05 % cream Topical, 2 times daily, Limit usage to 2 weeks    estradiol  (Vivelle -DOT) 0.0375 mg/24 hr 1 patch, transdermal, 2 times weekly  fexofenadine (ALLEGRA) 180 mg, Daily    gabapentin  (Neurontin ) 100 mg capsule TAKE 1 CAPSULE BY MOUTH 3 TIMES A DAY    gabapentin  (Neurontin ) 100 mg capsule TAKE 1 CAPSULE BY MOUTH TWICE A DAY    guanFACINE  ER (Intuniv ) 1 mg 24 hr tablet TAKE 1 TABLET BY MOUTH EVERY NIGHT AT BEDTIME AS DIRECTED    methylphenidate  (CONCERTA ) 36 mg, oral, Every morning, Do not crush, [name], or split.    pantoprazole  (ProtoNix ) 40 mg EC tablet TAKE 1 TABLET BY MOUTH EVERY DAY    venlafaxine  225 mg, oral, Daily, Do not crush, [name], or split.       Past Medical History:   Diagnosis Date    Anemia     with pregnancy    Anxiety     Asthma (Multi-HCC)     GERD (gastroesophageal reflux disease)     Migraines      Motion  sickness          Objective   Visit Vitals  Visit Vitals  OB Status IUD       Wt Readings from Last 10 Encounters:   03/23/24 70.8 kg   12/21/23 68.9 kg   12/14/23 68 kg   09/16/23 70.8 kg   06/18/23 75.8 kg   03/26/23 85 kg   02/12/23 82.1 kg   08/04/22 80.7 kg   01/16/22 81.6 kg   11/12/21 77.1 kg      Physical Exam  Constitutional:       General: She is not in acute distress.     Appearance: Normal appearance.   HENT:      Right Ear: Tympanic membrane and ear canal normal.      Left Ear: Tympanic membrane and ear canal normal.      Nose: Nose normal.      Mouth/Throat:      Mouth: Mucous membranes are moist.      Pharynx: Oropharynx is clear.   Eyes:      Conjunctiva/sclera: Conjunctivae normal.   Neck:      Thyroid : No thyroid  mass or thyromegaly.   Cardiovascular:      Rate and Rhythm: Normal rate and regular rhythm.      Pulses: Normal pulses.   Pulmonary:      Effort: Pulmonary effort is normal.      Breath sounds: Normal breath sounds.   Chest:   Breasts:     Breasts are symmetrical.      Right: No mass, nipple discharge or skin change.      Left: No mass, nipple discharge or skin change.   Abdominal:      General: Bowel sounds are normal.      Palpations: There is no hepatomegaly or mass.      Tenderness: There is no abdominal tenderness.   Genitourinary:     General: Normal vulva.      Pubic Area: No rash.       Labia:         Right: No rash or lesion.         Left: No rash or lesion.       Vagina: Normal.      Cervix: No cervical motion tenderness or lesion.      Uterus: Normal.       Adnexa: Right adnexa normal and left adnexa normal.        Right: No mass.          Left: No mass.  Comments: IUD string visible  Musculoskeletal:      Cervical back: Neck supple.   Lymphadenopathy:      Cervical: No cervical adenopathy.      Upper Body:      Right upper body: No axillary adenopathy.      Left upper body: No axillary adenopathy.   Skin:     Findings: No rash.   Neurological:      General: No focal  deficit present.      Mental Status: She is alert.   Psychiatric:         Mood and Affect: Mood is depressed. Affect is tearful.         Speech: Speech normal.         Behavior: Behavior normal.         Thought Content: Thought content normal.         Judgment: Judgment normal.        Assessments and Orders    Routine health maintenance  Attention deficit disorder of adult  Overweight (BMI 25.0-29.9)  Depressive disorder  Anxiety  Gastroesophageal reflux disease without esophagitis  Chronic migraine without aura without status migrainosus, not intractable  Piriformis syndrome of right side  Neck pain  Family history of sudden cardiac death  Other specified menopausal and perimenopausal disorders    -------------------------------------------  Discussion:    Routine health maintenance  Assessment & Plan:  During today's visit, we discussed appropriate immunizations, screening tests and preventive services. Discussion included importance of a heart-healthy, balanced diet, need for adequate physical exercise, attention to safety issues, sun protection and routine vision and dental exams.  Attention deficit disorder of adult  Assessment & Plan:  Vyvanse  is working well. Only takes it on work days.   Overweight (BMI 25.0-29.9)  Assessment & Plan:  Taking Wegovy .   Chronic right-sided low back pain with right-sided sciatica  Comments:  Better now. Piriformis syndrome has resolved.   Depression with anxiety  Assessment & Plan:  Doing well on Effexor . Still talking to therapist. No SI.   Gastroesophageal reflux disease without esophagitis  Assessment & Plan:  Botox  has really helped. She had a jackhammer esophagus. Is on Protonix  now. Sees Dr. Ragena.   Chronic migraine without aura without status migrainosus, not intractable  Assessment & Plan:  Have been great  Piriformis syndrome of right side  Assessment & Plan:  Improved  Neck pain  Comments:  She holds all her tension and stress in her neck.   Family history of  sudden cardiac death  Her brother died suddenly from cardiac arrest and he was apparently healthy. Ordered EKG last but not done. LDL 162 HDL 42 but low ASCVD risk ASCVD Risk is 1.6%% Discussed other options for determining cardiac risk including calcium CT score.   Other specified menopausal and perimenopausal disorders  Is on Vivelle  Dot in addition to her Skyla. Prescribed by Dr. Dyke who has left [facility].

## 2024-06-16 NOTE — Progress Notes (Signed)
 [facility]  [facility]  [address]  [address]  [place] [address]  Dept: [phone]  Dept Fax: [phone]     Patient ID: [name] is a 48 y.o. female who presents for Annual Exam.    Subjective   HPI    [name] is here today for an evaluation of nausea and vomiting. She was scheduled for a physical but she is too ill. She will reschedule that appointment.     Sxs began last night but had been overly emotional for a few days before that. Has body aches, nausea, feels like she will vomit or have diarrhea. Vomited all night long last night. No sore throat but has a runny nose that's chronic. Daughter had strep last week but Rafael's throat is OK. Had diarrhea once this morning. Changed her clothes twice from cold sweats. Feels awful.    The rash she had when I last saw her resolved with prednisone . She now has rash on her back that is itchy.     She is now seeing Burnard Slider, NP, for psych meds. She wasn't doing well in therapy. Is on guanfecine.     Problem List[1]  Current Outpatient Medications   Medication Instructions    albuterol  90 mcg/actuation inhaler INHALE 2 PUFFS BY MOUTH EVERY 6 HOURS AS NEEDED FOR WHEEZING    clobetasol  (Temovate ) 0.05 % cream Topical, 2 times daily, Limit usage to 2 weeks    estradiol  (Vivelle -DOT) 0.0375 mg/24 hr 1 patch, transdermal, 2 times weekly    fexofenadine (ALLEGRA) 180 mg, Daily    gabapentin  (Neurontin ) 100 mg capsule TAKE 1 CAPSULE BY MOUTH 3 TIMES A DAY    gabapentin  (Neurontin ) 100 mg capsule TAKE 1 CAPSULE BY MOUTH TWICE A DAY    guanFACINE  ER (Intuniv ) 1 mg 24 hr tablet TAKE 1 TABLET BY MOUTH EVERY NIGHT AT BEDTIME AS DIRECTED    methylphenidate  (CONCERTA ) 36 mg, oral, Every morning, Do not crush, chew, or split.    pantoprazole  (ProtoNix ) 40 mg EC tablet TAKE 1 TABLET BY MOUTH EVERY DAY    venlafaxine  225 mg, oral, Daily, Do not crush, chew, or split.       Objective   Visit Vitals  BP 110/88   Pulse 83   Temp 36.8 C (98.3 F)   Ht 1.575  m   Wt 73.5 kg Comment: 162lbs   LMP  (LMP Unknown)   SpO2 98%   BMI 29.62 kg/m   OB Status IUD   BSA 1.79 m   .phy    Physical Exam  Constitutional:       Appearance: Normal appearance. She is ill-appearing.   HENT:      Right Ear: Tympanic membrane and ear canal normal.      Left Ear: Tympanic membrane and ear canal normal.      Nose: No congestion or rhinorrhea.      Mouth/Throat:      Mouth: Mucous membranes are moist.      Pharynx: Oropharynx is clear.   Eyes:      Conjunctiva/sclera: Conjunctivae normal.   Neck:      Thyroid : No thyroid  mass or thyromegaly.   Cardiovascular:      Rate and Rhythm: Normal rate and regular rhythm.      Heart sounds: Normal heart sounds. No murmur heard.  Pulmonary:      Effort: Pulmonary effort is normal.      Breath sounds: Normal breath sounds.  Abdominal:      General: Bowel sounds are normal. There is no distension.      Palpations: Abdomen is soft. There is no hepatomegaly, splenomegaly or mass.   Musculoskeletal:      Cervical back: Neck supple.   Lymphadenopathy:      Cervical: No cervical adenopathy.   Skin:     General: Skin is warm and dry.      Comments: Scattered pale pink papules on back   Neurological:      General: No focal deficit present.      Mental Status: She is alert.   Psychiatric:         Mood and Affect: Affect is tearful.         Thought Content: Thought content normal.       Assessment / Plan  Assessment & Plan  Viral illness  In house flu negative but will check a triple swab; could be false negative flu. Rapid strep negative. Will send out full throat culture. Could be Noro virus which is going around. Advised Tylenol around the clock. Two 325 mg tablets given in the office today.   Orders:    POCT Influenza A/B ID NOW ABBOTT    POC Strep A Molecular NAAT/ ID NOW ABBOTT    SARS CoV 2 RNA by PCR    Upper Respiratory Culture (Ear, Nose, Throat); Future    SARS/FLU/RSV; Future    Gastroenteritis  She has Zofran  at home. Advised clear liquids with  electrolytes. Advance diet as tolerated. Follow-up for any worsening or persistent signs or symptoms.          Patient Health Questionnaire-9 Score: 2   Interpretation: Negative screening.      Follow-up & Interventions:   - Maintain Current Treatment Plan for Depression.         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    Exercise-induced asthma    GERD (gastroesophageal reflux disease)    Migraine headache    BMI 30.0-30.9,adult    Overweight (BMI 25.0-29.9)    Piriformis syndrome of right side    Routine health maintenance    Rash    Dyskinesia of esophagus    Neck pain    Depressive disorder    Acute pharyngitis    Deviated nasal septum    Dysfunction of eustachian tube    Dystonia    Functional dysphagia    Hyperlipidemia    Indigestion    Seasonal allergies    Skin sensation disturbance    Somatic dysfunction of sacroiliac joint    Family history of sudden cardiac death    Other specified menopausal and perimenopausal disorders

## 2024-06-17 LAB — SARS/FLU/RSV
Influenza A, NAA: NOT DETECTED
Influenza B, NAA: NOT DETECTED
RSV, NAA: NOT DETECTED
SARS-CoV-2, NAA: NOT DETECTED

## 2024-06-20 LAB — UPPER RESPIRATORY CULTURE (EAR, NOSE, THROAT)

## 2024-06-28 MED ORDER — CLONIDINE HCL 0.1 MG TABLET
0.1 | ORAL_TABLET | ORAL | 0 refills | 30.00000 days | Status: AC
Start: 2024-06-28 — End: ?
  Filled 2024-06-28: qty 60, 30d supply, fill #0

## 2024-07-13 ENCOUNTER — Ambulatory Visit: Admit: 2024-07-13 | Discharge: 2024-07-13 | Payer: PRIVATE HEALTH INSURANCE | Primary: Family Medicine

## 2024-07-13 DIAGNOSIS — R21 Rash and other nonspecific skin eruption: Principal | ICD-10-CM

## 2024-07-13 MED ORDER — PREDNISONE 10 MG TABLET
10 | ORAL_TABLET | ORAL | 0 refills | 30.00000 days | Status: AC
Start: 2024-07-13 — End: 2024-07-25
  Filled 2024-07-13: qty 42, 12d supply, fill #0

## 2024-07-13 MED ORDER — METHYLPHENIDATE ER 36 MG TABLET,EXTENDED RELEASE 24 HR
36 | ORAL_TABLET | Freq: Every morning | ORAL | 0 refills | 30.00000 days | Status: DC
Start: 2024-07-13 — End: 2024-08-08
  Filled 2024-07-13: qty 30, 30d supply, fill #0

## 2024-07-13 MED ORDER — GABAPENTIN 100 MG CAPSULE
100 | ORAL_CAPSULE | Freq: Two times a day (BID) | ORAL | 0 refills | 30.00000 days | Status: AC
Start: 2024-07-13 — End: ?
  Filled 2024-07-13: qty 180, 90d supply, fill #0

## 2024-07-13 NOTE — Progress Notes (Signed)
 [facility]  [facility]  [address]  [address]  [place] [address]  Dept: [phone]  Dept Fax: [phone]     Patient ID: [name] is a 49 y.o. female who presents for Rash (Rash all over body since December 1st, extremely itchy. ).    Subjective   Rash        [name] is here today for an evaluation of a rash. She states this is same one she had in October that responded quickly to prednisone . Back then it was located under her breasts, on her labia and at her upper thighs. This rash is mostly lower arms and upper buttocks. Today she also notices it on her back at her bra strap. Other than the itching, she has no other new symptoms. She has had long standing joint pains in her hands and elbows.     I saw her in early December  with a GI bug. At that point she felt the rash was starting to come back. Since then it has become more aggressive. She is very itchy. She has tried Allegra and applying triamcinolone  cream daily.     She cannot identify any possible triggers. Uses Tide Free and Clear. No dryer sheets. No air fragrances. No topicals. Is applying Ceravee moisturizer.     She has been very stressed as her divorce will be finalized this month. Having some issues with her daughter.     Saw allergist years ago. Has lots of allergies to plants, trees. They advised allergy shots which she declined.     Problem List[1]  Current Outpatient Medications   Medication Instructions    albuterol  90 mcg/actuation inhaler INHALE 2 PUFFS BY MOUTH EVERY 6 HOURS AS NEEDED FOR WHEEZING    cloNIDine  (Catapres ) 0.1 mg tablet Take 1 tablet by mouth twice a day as needed start night    estradiol  (Vivelle -DOT) 0.0375 mg/24 hr 1 patch, transdermal, 2 times weekly    fexofenadine (ALLEGRA) 180 mg, Daily    gabapentin  (Neurontin ) 100 mg capsule TAKE 1 CAPSULE BY MOUTH TWICE A DAY    guanFACINE  ER (Intuniv ) 1 mg 24 hr tablet TAKE 1 TABLET BY MOUTH EVERY NIGHT AT BEDTIME AS DIRECTED    methylphenidate   (CONCERTA ) 36 mg, oral, Every morning, Do not crush, chew, or split.    pantoprazole  (ProtoNix ) 40 mg EC tablet TAKE 1 TABLET BY MOUTH EVERY DAY    venlafaxine  225 mg, oral, Daily, Do not crush, chew, or split.       Objective   Visit Vitals  BP 122/89   Pulse 86   Wt 76.2 kg Comment: 168lbs   LMP  (LMP Unknown)   SpO2 97%   BMI 30.72 kg/m   OB Status IUD   BSA 1.83 m       Physical Exam  Constitutional:       Appearance: Normal appearance.   HENT:      Mouth/Throat:      Mouth: Mucous membranes are moist.      Pharynx: Oropharynx is clear.   Neck:      Thyroid : No thyroid  mass or thyromegaly.   Cardiovascular:      Rate and Rhythm: Normal rate and regular rhythm.      Heart sounds: Normal heart sounds.   Pulmonary:      Effort: Pulmonary effort is normal.      Breath sounds: Normal breath sounds.   Musculoskeletal:      Cervical back:  Neck supple.   Lymphadenopathy:      Cervical: No cervical adenopathy.   Skin:     General: Skin is warm and dry.      Comments: Rash as above on lower arms, bra strap area and upper buttocks. Positive dermatographism.    Neurological:      General: No focal deficit present.      Mental Status: She is alert.   Psychiatric:         Thought Content: Thought content normal.                   Assessment / Plan  Assessment & Plan  Rash  She is getting intermittent rashes. Also mentions a background of joint pain. She has positive dermatographism although it resolves quickly. She's had no improvement with antihistamines or triamcinolone  cream. Will check labs. I referred her to derm. She has photos to show them if the rash has resolved.       Orders:    CBC and differential; Future    Rheumatoid factor; Future    Cyclic citrul peptide antibody, IgG; Future    C-reactive protein; Future    ANA IFA Rflx Titer/Pattern; Future    Sedimentation rate, automated; Future    [facility]; Future    predniSONE  (Deltasone ) 10 mg tablet; Take 6 tablets  by mouth once daily for 2  days, THEN 5 tablets  once daily for 2 days, THEN 4 tablets  once daily for 2 days, THEN 3 tablets once daily for 2 days, THEN 2 tablets once daily for 2 days, THEN 1 tablet once daily for 2 days.    Polyarthralgia    Orders:    CBC and differential; Future    Rheumatoid factor; Future    Cyclic citrul peptide antibody, IgG; Future    C-reactive protein; Future    ANA IFA Rflx Titer/Pattern; Future    Sedimentation rate, automated; Future    She will follow-up in February at her physical; sooner prn.         Total visit time 40 min. which includes visit preparation, review of consult notes, face to face time with patient, ordering/reviewing labs/imaging/meds and documentation of visit.                 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    Exercise-induced asthma    GERD (gastroesophageal reflux disease)    Migraine headache    BMI 30.0-30.9,adult    Overweight (BMI 25.0-29.9)    Piriformis syndrome of right side    Routine health maintenance    Rash    Dyskinesia of esophagus    Neck pain    Depressive disorder    Acute pharyngitis    Deviated nasal septum    Dysfunction of eustachian tube    Dystonia    Functional dysphagia    Hyperlipidemia    Indigestion    Seasonal allergies    Skin sensation disturbance    Somatic dysfunction of sacroiliac joint    Family history of sudden cardiac death    Other specified menopausal and perimenopausal disorders

## 2024-07-13 NOTE — Assessment & Plan Note (Signed)
 She is getting intermittent rashes. Also mentions a background of joint pain. She has positive dermatographism although it resolves quickly. She's had no improvement with antihistamines or triamcinolone  cream. Will check labs. I referred her to derm. She has photos to show them if the rash has resolved.       Orders:    CBC and differential; Future    Rheumatoid factor; Future    Cyclic citrul peptide antibody, IgG; Future    C-reactive protein; Future    ANA IFA Rflx Titer/Pattern; Future    Sedimentation rate, automated; Future    [facility]; Future    predniSONE  (Deltasone ) 10 mg tablet; Take 6 tablets  by mouth once daily for 2 days, THEN 5 tablets  once daily for 2 days, THEN 4 tablets  once daily for 2 days, THEN 3 tablets once daily for 2 days, THEN 2 tablets once daily for 2 days, THEN 1 tablet once daily for 2 days.

## 2024-07-13 NOTE — Telephone Encounter (Signed)
 OV today  Cpe 08/08/24    Pmp 05/14/24 30 for 30  Utox 03/23/24

## 2024-07-14 LAB — CBC W/DIFF

## 2024-07-19 LAB — CBC W/DIFF
ABSOLUTE BASOPHILS: 32 {cells}/uL (ref 0–200)
ABSOLUTE EOSINOPHILS: 122 {cells}/uL (ref 15–500)
ABSOLUTE LYMPHOCYTES: 2033 {cells}/uL (ref 850–3900)
ABSOLUTE MONOCYTES: 510 {cells}/uL (ref 200–950)
ABSOLUTE NEUTROPHILS: 5403 {cells}/uL (ref 1500–7800)
BASOPHILS: 0.4 %
EOSINOPHILS: 1.5 %
HEMATOCRIT: 39.6 % (ref 35.9–46.0)
HEMOGLOBIN: 13.6 g/dL (ref 11.7–15.5)
LYMPHOCYTES: 25.1 %
MCH: 31.1 pg (ref 27.0–33.0)
MCHC: 34.3 g/dL (ref 31.6–35.4)
MCV: 90.6 fL (ref 81.4–101.7)
MONOCYTES: 6.3 %
MPV: 10.5 fL (ref 7.5–12.5)
NEUTROPHILS: 66.7 %
Platelets: 249 Thousand/uL (ref 140–400)
RDW: 12.4 % (ref 11.0–15.0)
RED BLOOD CELL COUNT: 4.37 Million/uL (ref 3.80–5.10)
WHITE BLOOD CELL COUNT: 8.1 Thousand/uL (ref 3.8–10.8)

## 2024-07-19 LAB — ANA TITER AND PATTERN (REFLEX ONLY): EXT ANA TITER: 1:80 {titer} — ABNORMAL HIGH

## 2024-07-19 LAB — SED RATE BY MODIFIED WESTERGREN: SED RATE BY MODIFIED WESTERGREN: 17 mm/h (ref <=20)

## 2024-07-19 LAB — C-REACTIVE PROTEIN: C-REACTIVE PROTEIN: <3 mg/L (ref <8.0)

## 2024-07-19 LAB — CYCLIC CITRUL PEPTIDE: EXT CYCLIC CITRULLINATED PEPTIDE (CCP) AB (IGG): <16 U

## 2024-07-19 LAB — ANA BY IFA RFLX TITER/PATTERN: EXT ANA SCREEN, IFA: POSITIVE — AB

## 2024-07-19 LAB — RHEUMATOID FACTOR: RHEUMATOID FACTOR: <10 [IU]/mL (ref <14)

## 2024-07-19 MED ORDER — CYCLOSPORINE 0.05 % EYE DROPS IN A DROPPERETTE
0.05 | OPHTHALMIC | 3 refills | 28.00000 days | Status: AC
Start: 2024-07-19 — End: ?

## 2024-07-21 ENCOUNTER — Encounter: Payer: PRIVATE HEALTH INSURANCE | Primary: Family Medicine

## 2024-07-21 DIAGNOSIS — Z0000 Encounter for general adult medical examination without abnormal findings: Principal | ICD-10-CM

## 2024-07-21 MED FILL — ESTRADIOL 0.0375 MG/24 HR SEMIWEEKLY TRANSDERMAL PATCH: 0.0375 0.0375 mg/24 hr | TRANSDERMAL | 84 days supply | Qty: 24 | Fill #4 | Status: CP

## 2024-07-21 NOTE — Progress Notes (Unsigned)
 [facility]  [address]  [address]  [place] [address]  [phone]    Patient ID: [name] is a 49 y.o. female who presents for her annual exam    Concerns today:   [name], GYN, sent in estrogen patch for HRT. Has Skyla IUD for endometrial protecftion. I will take over if effective.   Midst of divorce. Mood  Recent rash. Sent in prednisone     Recent Consults and Problems:   11/2022 Dr. Luke, podiatry, left bunion cortisone injection  08/2022 Dr. Rik acute flare of lateral epicondylitis, was in a cast for a month  Dr. Lennart for dyskinesia of esophagus, had botox  to the esophagus and she is much better    Screening Tests:  PAP:   04/2018 NILM, negative HPV  02/2023 NILM, negative HPV  no GYN concerns, declined pelvic exam  Mammogram:   07/2021 [facility] 1   (no-show 07/2022), she will call to schedule  03/2024 [facility] 1  Colonoscopy: average risk,   start age 28, no FH colon cancer  Will refer to Dr. Sammie who has seen her for GI issues, IGC  Bone Density: average risk, start age 71  LDCT: N/A, never smoker    Immunizations:   [employer] x 8, most recent 04/2024  Hep B x 3  Flu 04/2024  Tdap 2018    ROS:   Skin care: nothing new  Vision: Lasik, hasn't seen eye specialist, had an eye exam @ [facility]  Dental care: UTD, flosses  Periods: had IUD x 16 years; no periods, would like hormone testing, bloated, emotional hot flashes; Liletta placed 09/02/2019, good until 2029. Dr. Ezzard inserted  Sexual activity: with husband, no issues  Contraception: Liletta inserted 08/2019, good through 08/2027  All other systems negative except as documented above.     Social History:  reports that she has never smoked. She has never used smokeless tobacco. She reports current alcohol use. She reports that she does not currently use drugs after having used the following drugs: Marijuana.     Diet: healthy for the most part, tried Wegovy  but it made her really nauseous; went to [employer], stopped it  Exercise:  exercise hurts; stopped seeing [employer], chiropractor because her back is good   Alcohol: minimal  Drugs: THC gummies couple times a week, no ativan  Tobacco: never  Occupation: labor and delivery at [employer], hasn't had ativan in a couple of years  Household/Family: husband Ozell, 3 kids Mliss Chew, Missy      Family History: family history includes Anxiety disorder in her father; Basal cell carcinoma in her father; Cardiomyopathy in her father; Depression in her father; Diabetes in her mother; Drug abuse in her brother; Heart attack in her maternal grandmother; Heart disease in her father; Heart failure in her mother; Hypertension in her mother; Hypothyroidism in her mother; orthostatic tremor in her mother; renal cell cancer in her mother.     Recent changes in Family History: sister died of lung cancer last year, brother died in 05/11/24from sudden cardiac arrest    Medications:    Current Outpatient Medications   Medication Instructions    albuterol  90 mcg/actuation inhaler INHALE 2 PUFFS BY MOUTH EVERY 6 HOURS AS NEEDED FOR WHEEZING    clobetasol  (Temovate ) 0.05 % cream Topical, 2 times daily, Limit usage to 2 weeks    estradiol  (Vivelle -DOT) 0.0375 mg/24 hr 1 patch, transdermal, 2 times weekly    fexofenadine (ALLEGRA) 180  mg, Daily    gabapentin  (Neurontin ) 100 mg capsule TAKE 1 CAPSULE BY MOUTH 3 TIMES A DAY    gabapentin  (Neurontin ) 100 mg capsule TAKE 1 CAPSULE BY MOUTH TWICE A DAY    guanFACINE  ER (Intuniv ) 1 mg 24 hr tablet TAKE 1 TABLET BY MOUTH EVERY NIGHT AT BEDTIME AS DIRECTED    methylphenidate  (CONCERTA ) 36 mg, oral, Every morning, Do not crush, chew, or split.    pantoprazole  (ProtoNix ) 40 mg EC tablet TAKE 1 TABLET BY MOUTH EVERY DAY    venlafaxine  225 mg, oral, Daily, Do not crush, chew, or split.       Past Medical History:   Diagnosis Date    ADHD (attention deficit hyperactivity disorder)     Allergic     Allergic rhinitis     Anemia     with pregnancy    Anxiety     Asthma      Depression     GERD (gastroesophageal reflux disease)     Migraines     Motion sickness     Varicella          Objective     Visit Vitals  OB Status IUD       Wt Readings from Last 10 Encounters:   06/16/24 73.5 kg   03/23/24 70.8 kg   12/21/23 68.9 kg   12/14/23 68 kg   09/16/23 70.8 kg   06/18/23 75.8 kg   03/26/23 85 kg   02/12/23 82.1 kg   08/04/22 80.7 kg   01/16/22 81.6 kg      Physical Exam  Constitutional:       General: She is not in acute distress.     Appearance: Normal appearance.   HENT:      Right Ear: Tympanic membrane and ear canal normal.      Left Ear: Tympanic membrane and ear canal normal.      Nose: Nose normal.      Mouth/Throat:      Mouth: Mucous membranes are moist.      Pharynx: Oropharynx is clear.   Eyes:      Conjunctiva/sclera: Conjunctivae normal.   Neck:      Thyroid : No thyroid  mass or thyromegaly.   Cardiovascular:      Rate and Rhythm: Normal rate and regular rhythm.      Pulses: Normal pulses.   Pulmonary:      Effort: Pulmonary effort is normal.      Breath sounds: Normal breath sounds.   Chest:   Breasts:     Breasts are symmetrical.      Right: No mass, nipple discharge or skin change.      Left: No mass, nipple discharge or skin change.   Abdominal:      General: Bowel sounds are normal.      Palpations: There is no hepatomegaly or mass.      Tenderness: There is no abdominal tenderness.   Genitourinary:     General: Normal vulva.      Pubic Area: No rash.       Labia:         Right: No rash or lesion.         Left: No rash or lesion.       Vagina: Normal.      Cervix: No cervical motion tenderness or lesion.      Uterus: Normal.       Adnexa: Right adnexa normal and left adnexa normal.  Right: No mass.          Left: No mass.        Comments: IUD string visible  Musculoskeletal:      Cervical back: Neck supple.   Lymphadenopathy:      Cervical: No cervical adenopathy.      Upper Body:      Right upper body: No axillary adenopathy.      Left upper body: No axillary  adenopathy.   Skin:     Findings: No rash.   Neurological:      General: No focal deficit present.      Mental Status: She is alert.   Psychiatric:         Mood and Affect: Mood is depressed. Affect is tearful.         Speech: Speech normal.         Behavior: Behavior normal.         Thought Content: Thought content normal.         Judgment: Judgment normal.        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 Discussion included importance of a heart-healthy, balanced diet, need for adequate physical exercise, attention to safety issues, sun protection and routine vision and dental exams.  Attention deficit disorder of adult  Assessment & Plan:  Vyvanse  is working well. Only takes it on work days.   Overweight (BMI 25.0-29.9)  Assessment & Plan:  Taking Wegovy .   Chronic right-sided low back pain with right-sided sciatica  Comments:  Better now. Piriformis syndrome has resolved.   Depression with anxiety  Assessment & Plan:  Doing well on Effexor . Still talking to therapist. No SI.   Gastroesophageal reflux disease without esophagitis  Assessment & Plan:  Botox  has really helped. She had a jackhammer esophagus. Is on Protonix  now. Sees Dr. Ragena.   Chronic migraine without aura without status migrainosus, not intractable  Assessment & Plan:  Have  been great  Piriformis syndrome of right side  Assessment & Plan:  Improved  Neck pain  Comments:  She holds all her tension and stress in her neck.   Family history of sudden cardiac death  Her brother died suddenly from cardiac arrest and he was apparently healthy. Ordered EKG last but not done. LDL 162 HDL 42 but low ASCVD risk ASCVD Risk is 1.3%% Discussed other options for determining cardiac risk including calcium CT score.   Other specified menopausal and perimenopausal disorders  Is on Vivelle  Dot in addition to her Skyla. Prescribed by Dr. Dyke who has left [facility].

## 2024-08-08 ENCOUNTER — Ambulatory Visit: Admit: 2024-08-08 | Discharge: 2024-08-08 | Payer: PRIVATE HEALTH INSURANCE | Primary: Family Medicine

## 2024-08-08 DIAGNOSIS — Z0000 Encounter for general adult medical examination without abnormal findings: Principal | ICD-10-CM

## 2024-08-08 MED ORDER — PANTOPRAZOLE 40 MG TABLET,DELAYED RELEASE
40 | ORAL_TABLET | ORAL | 3 refills | 30.00000 days | Status: AC
Start: 2024-08-08 — End: ?
  Filled 2024-08-08: qty 90, 90d supply, fill #0

## 2024-08-09 MED ORDER — METHYLPHENIDATE ER 36 MG TABLET,EXTENDED RELEASE 24 HR
36 | ORAL_TABLET | Freq: Every morning | ORAL | 0 refills | 30.00000 days | Status: AC
Start: 2024-08-09 — End: 2024-09-09
  Filled 2024-08-10: qty 30, 30d supply, fill #0

## 2024-08-09 MED FILL — VENLAFAXINE ER 225 MG TABLET,EXTENDED RELEASE 24 HR: 225 225 mg | ORAL | 90 days supply | Qty: 90 | Fill #2 | Status: AC

## 2024-08-10 MED ORDER — OSELTAMIVIR 75 MG CAPSULE
75 | ORAL_CAPSULE | Freq: Every day | ORAL | 0 refills | 5.00000 days | Status: AC
Start: 2024-08-10 — End: 2024-08-20

## 2024-08-10 MED ORDER — BALOXAVIR MARBOXIL 40 MG TABLET
40 | ORAL_TABLET | Freq: Once | ORAL | 0 refills | Status: AC
Start: 2024-08-10 — End: 2024-08-10

## 2024-08-10 NOTE — Telephone Encounter (Signed)
 Exposed to influenza at home
